# Patient Record
Sex: Female | Born: 2000 | Race: Black or African American | Hispanic: No | Marital: Single | Smoking: Never smoker
Health system: Southern US, Community
[De-identification: ages and names within clinical notes are randomized; demographics above are authoritative.]

## PROBLEM LIST (undated history)

## (undated) DIAGNOSIS — Z789 Other specified health status: Secondary | ICD-10-CM

## (undated) HISTORY — PX: LEG SURGERY: SHX1003

---

## 2019-04-09 ENCOUNTER — Inpatient Hospital Stay (HOSPITAL_COMMUNITY): Payer: BC Managed Care – PPO | Admitting: Anesthesiology

## 2019-04-09 ENCOUNTER — Other Ambulatory Visit: Payer: Self-pay

## 2019-04-09 ENCOUNTER — Inpatient Hospital Stay (HOSPITAL_COMMUNITY): Payer: BC Managed Care – PPO

## 2019-04-09 ENCOUNTER — Encounter (HOSPITAL_COMMUNITY): Payer: Self-pay | Admitting: Emergency Medicine

## 2019-04-09 ENCOUNTER — Encounter (HOSPITAL_COMMUNITY): Admission: EM | Disposition: A | Discharge status: Home / Self Care | Payer: Self-pay | Source: Home / Self Care

## 2019-04-09 ENCOUNTER — Inpatient Hospital Stay (HOSPITAL_COMMUNITY)
Admission: EM | Admit: 2019-04-09 | Discharge: 2019-04-09 | Disposition: A | Discharge status: Home / Self Care | Payer: BC Managed Care – PPO | Attending: Obstetrics and Gynecology | Admitting: Obstetrics and Gynecology

## 2019-04-09 DIAGNOSIS — Z20828 Contact with and (suspected) exposure to other viral communicable diseases: Secondary | ICD-10-CM | POA: Diagnosis not present

## 2019-04-09 DIAGNOSIS — K661 Hemoperitoneum: Secondary | ICD-10-CM

## 2019-04-09 DIAGNOSIS — R109 Unspecified abdominal pain: Secondary | ICD-10-CM | POA: Diagnosis not present

## 2019-04-09 DIAGNOSIS — O26891 Other specified pregnancy related conditions, first trimester: Secondary | ICD-10-CM | POA: Diagnosis present

## 2019-04-09 DIAGNOSIS — Z3A01 Less than 8 weeks gestation of pregnancy: Secondary | ICD-10-CM | POA: Diagnosis not present

## 2019-04-09 DIAGNOSIS — O00102 Left tubal pregnancy without intrauterine pregnancy: Secondary | ICD-10-CM | POA: Diagnosis not present

## 2019-04-09 DIAGNOSIS — O26899 Other specified pregnancy related conditions, unspecified trimester: Secondary | ICD-10-CM

## 2019-04-09 HISTORY — PX: DIAGNOSTIC LAPAROSCOPY WITH REMOVAL OF ECTOPIC PREGNANCY: SHX6449

## 2019-04-09 HISTORY — DX: Other specified health status: Z78.9

## 2019-04-09 LAB — COMPREHENSIVE METABOLIC PANEL
ALT: 12 U/L (ref 0–44)
AST: 18 U/L (ref 15–41)
Albumin: 3.7 g/dL (ref 3.5–5.0)
Alkaline Phosphatase: 44 U/L (ref 38–126)
Anion gap: 11 (ref 5–15)
BUN: 8 mg/dL (ref 6–20)
CO2: 20 mmol/L — ABNORMAL LOW (ref 22–32)
Calcium: 9.1 mg/dL (ref 8.9–10.3)
Chloride: 102 mmol/L (ref 98–111)
Creatinine, Ser: 0.58 mg/dL (ref 0.44–1.00)
GFR calc Af Amer: >60 mL/min (ref >60)
GFR calc non Af Amer: >60 mL/min (ref >60)
Glucose, Bld: 101 mg/dL — ABNORMAL HIGH (ref 70–99)
Potassium: 3.5 mmol/L (ref 3.5–5.1)
Sodium: 133 mmol/L — ABNORMAL LOW (ref 135–145)
Total Bilirubin: 0.8 mg/dL (ref 0.3–1.2)
Total Protein: 7 g/dL (ref 6.5–8.1)

## 2019-04-09 LAB — URINALYSIS, ROUTINE W REFLEX MICROSCOPIC
Bacteria, UA: NONE SEEN
Bilirubin Urine: NEGATIVE
Glucose, UA: NEGATIVE mg/dL
Ketones, ur: 20 mg/dL — AB
Nitrite: NEGATIVE
Protein, ur: 30 mg/dL — AB
Specific Gravity, Urine: 1.029 (ref 1.005–1.030)
WBC, UA: >50 WBC/hpf — ABNORMAL HIGH (ref 0–5)
pH: 5 (ref 5.0–8.0)

## 2019-04-09 LAB — CBC
HCT: 28.8 % — ABNORMAL LOW (ref 36.0–46.0)
HCT: 28.6 % — ABNORMAL LOW (ref 36.0–46.0)
Hemoglobin: 9.6 g/dL — ABNORMAL LOW (ref 12.0–15.0)
Hemoglobin: 9.5 g/dL — ABNORMAL LOW (ref 12.0–15.0)
MCH: 27.8 pg (ref 26.0–34.0)
MCH: 28.3 pg (ref 26.0–34.0)
MCHC: 33.3 g/dL (ref 30.0–36.0)
MCHC: 33.2 g/dL (ref 30.0–36.0)
MCV: 83.5 fL (ref 80.0–100.0)
MCV: 85.1 fL (ref 80.0–100.0)
Platelets: 301 10*3/uL (ref 150–400)
Platelets: 302 10*3/uL (ref 150–400)
RBC: 3.45 MIL/uL — ABNORMAL LOW (ref 3.87–5.11)
RBC: 3.36 MIL/uL — ABNORMAL LOW (ref 3.87–5.11)
RDW: 14 % (ref 11.5–15.5)
RDW: 14.1 % (ref 11.5–15.5)
WBC: 15.4 10*3/uL — ABNORMAL HIGH (ref 4.0–10.5)
WBC: 13.5 10*3/uL — ABNORMAL HIGH (ref 4.0–10.5)
nRBC: 0 % (ref 0.0–0.2)
nRBC: 0 % (ref 0.0–0.2)

## 2019-04-09 LAB — WET PREP, GENITAL
Clue Cells Wet Prep HPF POC: NONE SEEN
Sperm: NONE SEEN
Trich, Wet Prep: NONE SEEN
WBC, Wet Prep HPF POC: NONE SEEN
Yeast Wet Prep HPF POC: NONE SEEN

## 2019-04-09 LAB — HCG, QUANTITATIVE, PREGNANCY: hCG, Beta Chain, Quant, S: 6838 m[IU]/mL — ABNORMAL HIGH (ref <5)

## 2019-04-09 LAB — ABO/RH: ABO/RH(D): A POS

## 2019-04-09 LAB — PREPARE RBC (CROSSMATCH)

## 2019-04-09 LAB — SARS CORONAVIRUS 2 BY RT PCR (HOSPITAL ORDER, PERFORMED IN ~~LOC~~ HOSPITAL LAB): SARS Coronavirus 2: NEGATIVE

## 2019-04-09 LAB — POC URINE PREG, ED: Preg Test, Ur: POSITIVE — AB

## 2019-04-09 SURGERY — LAPAROSCOPY, WITH ECTOPIC PREGNANCY SURGICAL TREATMENT
Anesthesia: General

## 2019-04-09 MED ORDER — MIDAZOLAM HCL 5 MG/5ML IJ SOLN
INTRAMUSCULAR | Status: DC | PRN
  Administered 2019-04-09: 2 mg via INTRAVENOUS

## 2019-04-09 MED ORDER — DEXAMETHASONE SODIUM PHOSPHATE 10 MG/ML IJ SOLN
INTRAMUSCULAR | Status: DC | PRN
  Administered 2019-04-09: 10 mg via INTRAVENOUS

## 2019-04-09 MED ORDER — MIDAZOLAM HCL 2 MG/2ML IJ SOLN
INTRAMUSCULAR | Status: AC
  Filled 2019-04-09: qty 2

## 2019-04-09 MED ORDER — SUCCINYLCHOLINE CHLORIDE 200 MG/10ML IV SOSY
PREFILLED_SYRINGE | INTRAVENOUS | Status: AC
  Filled 2019-04-09: qty 10

## 2019-04-09 MED ORDER — LIDOCAINE 2% (20 MG/ML) 5 ML SYRINGE
INTRAMUSCULAR | Status: AC
  Filled 2019-04-09: qty 5

## 2019-04-09 MED ORDER — IBUPROFEN 600 MG PO TABS: 600.0000 mg | ORAL_TABLET | Freq: Four times a day (QID) | ORAL | 1 refills | Status: DC | PRN

## 2019-04-09 MED ORDER — ONDANSETRON HCL 4 MG/2ML IJ SOLN
INTRAMUSCULAR | Status: AC
  Filled 2019-04-09: qty 2

## 2019-04-09 MED ORDER — LIDOCAINE 2% (20 MG/ML) 5 ML SYRINGE
INTRAMUSCULAR | Status: DC | PRN
  Administered 2019-04-09: 50 mg via INTRAVENOUS

## 2019-04-09 MED ORDER — FENTANYL CITRATE (PF) 250 MCG/5ML IJ SOLN
INTRAMUSCULAR | Status: AC
  Filled 2019-04-09: qty 5

## 2019-04-09 MED ORDER — SODIUM CHLORIDE 0.9 % IR SOLN
Status: DC | PRN
  Administered 2019-04-09: 3000 mL

## 2019-04-09 MED ORDER — SUCCINYLCHOLINE CHLORIDE 200 MG/10ML IV SOSY
PREFILLED_SYRINGE | INTRAVENOUS | Status: DC | PRN
  Administered 2019-04-09: 120 mg via INTRAVENOUS

## 2019-04-09 MED ORDER — SODIUM CHLORIDE 0.9% IV SOLUTION
Freq: Once | INTRAVENOUS | Status: AC
  Administered 2019-04-09: 16:00:00 via INTRAVENOUS

## 2019-04-09 MED ORDER — LACTATED RINGERS IV SOLN
INTRAVENOUS | Status: DC
  Administered 2019-04-09 (×2): via INTRAVENOUS

## 2019-04-09 MED ORDER — PROPOFOL 10 MG/ML IV BOLUS
INTRAVENOUS | Status: AC
  Filled 2019-04-09: qty 20

## 2019-04-09 MED ORDER — GLYCOPYRROLATE PF 0.2 MG/ML IJ SOSY
PREFILLED_SYRINGE | INTRAMUSCULAR | Status: AC
  Filled 2019-04-09: qty 1

## 2019-04-09 MED ORDER — PROPOFOL 10 MG/ML IV BOLUS
INTRAVENOUS | Status: DC | PRN
  Administered 2019-04-09: 140 mg via INTRAVENOUS

## 2019-04-09 MED ORDER — SUGAMMADEX SODIUM 200 MG/2ML IV SOLN
INTRAVENOUS | Status: DC | PRN
  Administered 2019-04-09: 200 mg via INTRAVENOUS

## 2019-04-09 MED ORDER — DEXAMETHASONE SODIUM PHOSPHATE 10 MG/ML IJ SOLN
INTRAMUSCULAR | Status: AC
  Filled 2019-04-09: qty 1

## 2019-04-09 MED ORDER — PHENYLEPHRINE HCL (PRESSORS) 10 MG/ML IV SOLN
INTRAVENOUS | Status: DC | PRN
  Administered 2019-04-09 (×2): 80 ug via INTRAVENOUS

## 2019-04-09 MED ORDER — ONDANSETRON HCL 4 MG/2ML IJ SOLN
INTRAMUSCULAR | Status: DC | PRN
  Administered 2019-04-09: 4 mg via INTRAVENOUS

## 2019-04-09 MED ORDER — OXYCODONE-ACETAMINOPHEN 5-325 MG PO TABS: 1.0000 | ORAL_TABLET | Freq: Four times a day (QID) | ORAL | 0 refills | Status: DC | PRN

## 2019-04-09 MED ORDER — ACETAMINOPHEN 10 MG/ML IV SOLN
INTRAVENOUS | Status: AC
  Filled 2019-04-09: qty 100

## 2019-04-09 MED ORDER — ROCURONIUM BROMIDE 10 MG/ML (PF) SYRINGE
PREFILLED_SYRINGE | INTRAVENOUS | Status: AC
  Filled 2019-04-09: qty 10

## 2019-04-09 MED ORDER — ARTIFICIAL TEARS OPHTHALMIC OINT
TOPICAL_OINTMENT | OPHTHALMIC | Status: AC
  Filled 2019-04-09: qty 3.5

## 2019-04-09 MED ORDER — PHENYLEPHRINE 40 MCG/ML (10ML) SYRINGE FOR IV PUSH (FOR BLOOD PRESSURE SUPPORT)
PREFILLED_SYRINGE | INTRAVENOUS | Status: AC
  Filled 2019-04-09: qty 10

## 2019-04-09 MED ORDER — ROCURONIUM BROMIDE 50 MG/5ML IV SOSY
PREFILLED_SYRINGE | INTRAVENOUS | Status: DC | PRN
  Administered 2019-04-09: 40 mg via INTRAVENOUS

## 2019-04-09 MED ORDER — BUPIVACAINE HCL 0.5 % IJ SOLN
INTRAMUSCULAR | Status: DC | PRN
  Administered 2019-04-09: 10 mL

## 2019-04-09 MED ORDER — LACTATED RINGERS IV SOLN: INTRAVENOUS | Status: DC

## 2019-04-09 MED ORDER — FENTANYL CITRATE (PF) 100 MCG/2ML IJ SOLN
INTRAMUSCULAR | Status: DC | PRN
  Administered 2019-04-09 (×2): 50 ug via INTRAVENOUS
  Administered 2019-04-09: 150 ug via INTRAVENOUS

## 2019-04-09 MED ORDER — ACETAMINOPHEN 10 MG/ML IV SOLN
INTRAVENOUS | Status: DC | PRN
  Administered 2019-04-09: 1000 mg via INTRAVENOUS

## 2019-04-09 SURGICAL SUPPLY — 37 items
APPLICATOR ARISTA FLEXITIP XL (MISCELLANEOUS) IMPLANT
COVER WAND RF STERILE (DRAPES) ×2 IMPLANT
DERMABOND ADVANCED (GAUZE/BANDAGES/DRESSINGS)
DERMABOND ADVANCED .7 DNX12 (GAUZE/BANDAGES/DRESSINGS) IMPLANT
DRSG OPSITE POSTOP 3X4 (GAUZE/BANDAGES/DRESSINGS) ×2 IMPLANT
DURAPREP 26ML APPLICATOR (WOUND CARE) ×2 IMPLANT
GLOVE BIO SURGEON STRL SZ 6.5 (GLOVE) ×2 IMPLANT
GLOVE BIOGEL PI IND STRL 6.5 (GLOVE) ×2 IMPLANT
GLOVE BIOGEL PI IND STRL 7.0 (GLOVE) ×4 IMPLANT
GLOVE BIOGEL PI INDICATOR 6.5 (GLOVE) ×2
GLOVE BIOGEL PI INDICATOR 7.0 (GLOVE) ×4
GLOVE ORTHOPEDIC STR SZ6.5 (GLOVE) ×2 IMPLANT
GOWN STRL REUS W/ TWL LRG LVL3 (GOWN DISPOSABLE) ×3 IMPLANT
GOWN STRL REUS W/TWL LRG LVL3 (GOWN DISPOSABLE) ×3
HEMOSTAT ARISTA ABSORB 3G PWDR (HEMOSTASIS) IMPLANT
KIT TURNOVER KIT B (KITS) ×2 IMPLANT
NEEDLE INSUFFLATION 14GA 120MM (NEEDLE) ×2 IMPLANT
NS IRRIG 1000ML POUR BTL (IV SOLUTION) ×2 IMPLANT
PACK LAPAROSCOPY BASIN (CUSTOM PROCEDURE TRAY) ×2 IMPLANT
PACK TRENDGUARD 450 HYBRID PRO (MISCELLANEOUS) ×1 IMPLANT
POUCH SPECIMEN RETRIEVAL 10MM (ENDOMECHANICALS) ×2 IMPLANT
PROTECTOR NERVE ULNAR (MISCELLANEOUS) ×4 IMPLANT
SET IRRIG TUBING LAPAROSCOPIC (IRRIGATION / IRRIGATOR) ×2 IMPLANT
SET TUBE SMOKE EVAC HIGH FLOW (TUBING) ×2 IMPLANT
SHEARS HARMONIC ACE PLUS 36CM (ENDOMECHANICALS) ×2 IMPLANT
SLEEVE ENDOPATH XCEL 5M (ENDOMECHANICALS) ×2 IMPLANT
SLEEVE XCEL OPT CAN 5 100 (ENDOMECHANICALS) ×2 IMPLANT
SUT MNCRL AB 4-0 PS2 18 (SUTURE) ×2 IMPLANT
SUT VICRYL 0 UR6 27IN ABS (SUTURE) ×2 IMPLANT
SUT VICRYL 4-0 PS2 18IN ABS (SUTURE) ×2 IMPLANT
TOWEL GREEN STERILE FF (TOWEL DISPOSABLE) ×4 IMPLANT
TRAY FOLEY W/BAG SLVR 14FR (SET/KITS/TRAYS/PACK) ×2 IMPLANT
TRENDGUARD 450 HYBRID PRO PACK (MISCELLANEOUS) ×2
TROCAR XCEL DIL TIP R 11M (ENDOMECHANICALS) ×2 IMPLANT
TROCAR XCEL NON-BLD 11X100MML (ENDOMECHANICALS) ×2 IMPLANT
TROCAR XCEL NON-BLD 5MMX100MML (ENDOMECHANICALS) ×2 IMPLANT
WARMER LAPAROSCOPE (MISCELLANEOUS) ×2 IMPLANT

## 2019-04-09 NOTE — Discharge Instructions (Signed)
Ruptured Ectopic Pregnancy ° °An ectopic pregnancy is when a fertilized egg attaches (implants) outside of the uterus, usually in a fallopian tube. A ruptured ectopic pregnancy is when the fallopian tube tears or bursts. This results in internal bleeding, intense abdominal pain, and sometimes, vaginal bleeding. °Most ectopic pregnancies occur in the fallopian tube. In rare cases, it may occur on the ovary, intestine, pelvis, or cervix. An ectopic pregnancy does not have the ability to develop into a normal, healthy baby. A ruptured ectopic pregnancy can affect your ability to have children (fertility), depending on damage it causes to your reproductive organs. °Ruptured ectopic pregnancy is a medical emergency. If not treated immediately, it can lead to blood loss, shock, or even death. °What are the causes? °Most ectopic pregnancies are caused by damage to the fallopian tubes. The damage prevents the fertilized egg from implanting in the uterus. In some cases, the cause may not be known. °What increases the risk? °You are at increased risk for an ectopic pregnancy if: °· You have had a previous ectopic pregnancy. °· You have had previous fallopian tube surgery. °· You have had previous surgery to have the fallopian tubes tied (tubal ligation). °· You have had infertility treatments or have a history of infertility. °· You have been exposed to DES. DES is a medicine that was used until 1971 and had effects on babies whose mothers took the medicine. °· You use an IUD (intrauterine device) for birth control. °· You use progestin-only oral contraception for birth control. °· You have a history of pelvic inflammatory disease (PID). °· You have a history of endometriosis. °· You smoke. °· You became sexually active before 20 years of age. °· You have multiple sexual partners. °What are the signs or symptoms? °Symptoms of a ruptured ectopic pregnancy and internal bleeding may include: °· Sudden, severe pain in the abdomen  and pelvis. °· Dizziness or fainting. °· Pain in the shoulder area. °· Vaginal bleeding. °How is this diagnosed? °This condition is diagnosed based on your medical history, symptoms, a physical exam, and tests, which may include: °· A pregnancy test. °· An ultrasound. °· Measuring the levels of the pregnancy hormone in the bloodstream. °· Taking a sample of tissue from the uterus (dilation and curettage, D&C). °· Surgery to visually examine the inside of the abdomen using a lighted tube (laparoscopy). °How is this treated? °This condition is treated with IV fluids and emergency surgery to remove the ectopic pregnancy and repair the area where the rupture occured. °If you have lost a lot of blood, you may need a blood transfusion. °If you are Rh negative and your baby's father is Rh positive, or the Rh type of the father is unknown, you may receive a Rho (D) immune globulin shot. This is to prevent Rh problems in future pregnancies. °Additional medicines may be given. °Get help right away if: °· You are taking medicines to treat an ectopic pregnancy and you develop symptoms of a rupture. These include: °? Fever or chills. °? Shoulder pain. °? Vaginal bleeding. °? Nausea and vomiting. °? Severe abdominal pain or cramping. °? Feeling light-headed or fainting. °Summary °· An ectopic pregnancy is when a fertilized egg attaches (implants) outside of the uterus, usually in a fallopian tube. A ruptured ectopic pregnancy is when the fallopian tube tears or bursts. °· Ruptured ectopic pregnancy is a medical emergency. If not treated immediately, it can lead to blood loss, shock, or even death. °· This condition is treated with   IV fluids and emergency surgery to remove the ectopic pregnancy and repair the area where the rupture occured. If you have lost a lot of blood, you may need a blood transfusion. This information is not intended to replace advice given to you by your health care provider. Make sure you discuss any  questions you have with your health care provider. Document Released: 05/29/2000 Document Revised: 05/14/2017 Document Reviewed: 08/19/2016 Elsevier Patient Education  2020 [employer].

## 2019-04-09 NOTE — MAU Note (Signed)
Angelica Thomas is a 18 y.o. at [redacted]w[redacted]d here in MAU reporting: for 8 days she has been having cramps and bleeding, for the past 3 days it has been worse. States she is wearing a pad and on the pad she will see a spot of blood but then when she sits on the toilet more blood comes out.  Onset of complaint: ongoing  Pain score: 8/10  Vitals:   04/09/19 1120 04/09/19 1156  BP: 109/66 114/73  Pulse:  95  Resp:  16  Temp:  98.4 F (36.9 C)  SpO2:  100%     Lab orders placed from triage: UA

## 2019-04-09 NOTE — Anesthesia Procedure Notes (Signed)
Procedure Name: Intubation Date/Time: 04/09/2019 4:41 PM Performed by: Suzy Bouchard, CRNA Pre-anesthesia Checklist: Patient identified, Suction available, Patient being monitored, Timeout performed and Emergency Drugs available Patient Re-evaluated:Patient Re-evaluated prior to induction Oxygen Delivery Method: Circle system utilized Preoxygenation: Pre-oxygenation with 100% oxygen Induction Type: IV induction and Rapid sequence Laryngoscope Size: Glidescope and 3 Grade View: Grade I Tube type: Oral Tube size: 7.5 mm Number of attempts: 1 Airway Equipment and Method: Stylet and Video-laryngoscopy Placement Confirmation: ETT inserted through vocal cords under direct vision,  positive ETCO2 and breath sounds checked- equal and bilateral Secured at: 22 cm Tube secured with: Tape Dental Injury: Teeth and Oropharynx as per pre-operative assessment

## 2019-04-09 NOTE — MAU Provider Note (Addendum)
History     CSN: [id number]  Arrival date and time: 04/09/19 3086   First Provider Initiated Contact with Patient 04/09/19 1228      Chief Complaint  Patient presents with  . Vaginal Bleeding  . Abdominal Pain  . Back Pain  . Routine Prenatal Visit   HPI Nylia Gavina is a 18 y.o. G1P0 at [redacted]w[redacted]d who presents with vaginal bleeding and abdominal pain. She reports intermittent cramping for the past 10 days that has been worsening. Has been taking ibuprofen with some improvement. She also reports vaginal bleeding in the toilet and on the toilet paper for the past 8 days. It began as a small amount and has since increased. States it is lighter than a period and does not require the use of the pad.  Reports some nausea and dizziness Denies vaginal discharge, vomiting  OB History    Gravida  1   Para      Term      Preterm      AB      Living        SAB      TAB      Ectopic      Multiple      Live Births              Past Medical History:  Diagnosis Date  . Medical history non-contributory     Past Surgical History:  Procedure Laterality Date  . LEG SURGERY     left    History reviewed. No pertinent family history.  Social History   Tobacco Use  . Smoking status: Never Smoker  . Smokeless tobacco: Never Used  Substance Use Topics  . Alcohol use: Yes  . Drug use: Not Currently    Allergies: No Known Allergies  No medications prior to admission.    Review of Systems  Constitutional: Negative for fatigue and fever.  Gastrointestinal: Positive for abdominal pain and nausea. Negative for constipation, diarrhea and vomiting.  Genitourinary: Positive for flank pain and vaginal bleeding. Negative for dysuria and vaginal discharge.  Neurological: Positive for dizziness.   Physical Exam   Blood pressure 114/73, pulse 95, temperature 98.4 F (36.9 C), temperature source Oral, resp. rate 16, height 5' 6.5" (1.689 m), weight 67.6 kg, last menstrual  period 02/24/2019, SpO2 100 %.  Physical Exam  Constitutional: She is oriented to person, place, and time. She appears well-developed and well-nourished.  HENT:  Head: Normocephalic.  Eyes: Conjunctivae are normal.  Neck: Normal range of motion.  Cardiovascular: Normal rate.  Respiratory: Effort normal.  GI: Soft. She exhibits no distension. There is no abdominal tenderness.  Genitourinary:    Uterus normal.  Cervix exhibits no motion tenderness. Right adnexum displays no tenderness. Left adnexum displays no tenderness.    Vaginal bleeding present.  There is bleeding in the vagina.  Neurological: She is alert and oriented to person, place, and time.  Skin: Skin is warm and dry.   Results for orders placed or performed during the hospital encounter of 04/09/19 (from the past 24 hour(s))  POC Urine Pregnancy, ED (not at [facility])     Status: Abnormal   Collection Time: 04/09/19 10:46 AM  Result Value Ref Range   Preg Test, Ur POSITIVE (A) NEGATIVE  Urinalysis, Routine w reflex microscopic     Status: Abnormal   Collection Time: 04/09/19 12:11 PM  Result Value Ref Range   Color, Urine AMBER (A) YELLOW   APPearance HAZY (A) CLEAR  Specific Gravity, Urine 1.029 1.005 - 1.030   pH 5.0 5.0 - 8.0   Glucose, UA NEGATIVE NEGATIVE mg/dL   Hgb urine dipstick MODERATE (A) NEGATIVE   Bilirubin Urine NEGATIVE NEGATIVE   Ketones, ur 20 (A) NEGATIVE mg/dL   Protein, ur 30 (A) NEGATIVE mg/dL   Nitrite NEGATIVE NEGATIVE   Leukocytes,Ua MODERATE (A) NEGATIVE   RBC / HPF 0-5 0 - 5 RBC/hpf   WBC, UA >50 (H) 0 - 5 WBC/hpf   Bacteria, UA NONE SEEN NONE SEEN   Mucus PRESENT   CBC     Status: Abnormal   Collection Time: 04/09/19  1:00 PM  Result Value Ref Range   WBC 15.4 (H) 4.0 - 10.5 K/uL   RBC 3.45 (L) 3.87 - 5.11 MIL/uL   Hemoglobin 9.6 (L) 12.0 - 15.0 g/dL   HCT 16.3 (L) 84.6 - 65.9 %   MCV 83.5 80.0 - 100.0 fL   MCH 27.8 26.0 - 34.0 pg   MCHC 33.3 30.0 - 36.0 g/dL   RDW 93.5 70.1 - 77.9 %    Platelets 301 150 - 400 K/uL   nRBC 0.0 0.0 - 0.2 %  Comprehensive metabolic panel     Status: Abnormal   Collection Time: 04/09/19  1:00 PM  Result Value Ref Range   Sodium 133 (L) 135 - 145 mmol/L   Potassium 3.5 3.5 - 5.1 mmol/L   Chloride 102 98 - 111 mmol/L   CO2 20 (L) 22 - 32 mmol/L   Glucose, Bld 101 (H) 70 - 99 mg/dL   BUN 8 6 - 20 mg/dL   Creatinine, Ser 3.90 0.44 - 1.00 mg/dL   Calcium 9.1 8.9 - 30.0 mg/dL   Total Protein 7.0 6.5 - 8.1 g/dL   Albumin 3.7 3.5 - 5.0 g/dL   AST 18 15 - 41 U/L   ALT 12 0 - 44 U/L   Alkaline Phosphatase 44 38 - 126 U/L   Total Bilirubin 0.8 0.3 - 1.2 mg/dL   GFR calc non Af Amer >60 >60 mL/min   GFR calc Af Amer >60 >60 mL/min   Anion gap 11 5 - 15  ABO/Rh     Status: None   Collection Time: 04/09/19  1:00 PM  Result Value Ref Range   ABO/RH(D) A POS    No rh immune globuloin      NOT A RH IMMUNE GLOBULIN CANDIDATE, PT RH POSITIVE Performed at [facility], [address] [place], [place] [address]   hCG, quantitative, pregnancy     Status: Abnormal   Collection Time: 04/09/19  1:00 PM  Result Value Ref Range   hCG, Beta Chain, Quant, S 6,838 (H) <5 mIU/mL  Wet prep, genital     Status: None   Collection Time: 04/09/19  1:01 PM  Result Value Ref Range   Yeast Wet Prep HPF POC NONE SEEN NONE SEEN   Trich, Wet Prep NONE SEEN NONE SEEN   Clue Cells Wet Prep HPF POC NONE SEEN NONE SEEN   WBC, Wet Prep HPF POC NONE SEEN NONE SEEN   Sperm NONE SEEN    US Ob Comp Less 14 Wks  Result Date: 04/09/2019 CLINICAL DATA:  18 year old pregnant female presents with abdominal pain. Quantitative beta HCG [id number]. EDC by LMP: 12/01/2019, projecting to an expected gestational age of [redacted] weeks 2 days. EXAM: OBSTETRIC <14 WK [place] AND TRANSVAGINAL OB US TECHNIQUE: Both transabdominal and transvaginal ultrasound examinations were performed for complete evaluation  of the gestation as well as the maternal uterus, adnexal regions, and pelvic cul-de-sac.  Transvaginal technique was performed to assess early pregnancy. COMPARISON:  None. FINDINGS: Intrauterine gestational sac: None Maternal uterus/adnexae: Anteverted uterus is normal in size and configuration, with no uterine fibroids or other myometrial abnormalities. Bilayer endometrial thickness 15 mm. Heterogeneous endometrium with small amount of ill-defined fluid in the left endometrial cavity, with no discrete endometrial mass. Right ovary measures 3.0 x 1.5 x 1.5 cm and is normal. No right adnexal mass. There is a 3.5 x 3.5 x 4.3 cm cyst left adnexal mass between the uterus and left ovary, with an internal structure resembling a gestational sac with thick vascularized wall and no yolk sac or embryo demonstrated. The separate left ovary measures 3.9 x 2.2 x 1.8 cm. There is a large amount of ill-defined clot in the left adnexa (measuring 6.3 x 5.0 x 4.7 cm in largest dimensions). There is a large volume of hemoperitoneum throughout the pelvis extending into the bilateral pericolic gutters and right subhepatic space. IMPRESSION: 1. Ultrasound findings are compatible with ruptured left tubal ectopic gestation with large amount of blood products in the left adnexa measuring 6.3 x 5.0 x 4.7 cm in largest dimensions. Central gestational sac-like structure within this left adnexal mass. No yolk sac or embryo demonstrated within this left adnexal mass. 2. Large volume hemoperitoneum extending into the paracolic gutters and right subhepatic space. 3. No intrauterine gestational sac. Heterogeneous thickened endometrium compatible with blood products. Critical Value/emergent results were called by telephone at the time of interpretation on 04/09/2019 at 3:11 pm to provider Luna KitchensKATHRYN Tayana Shankle and OB/GYN DR. DAVIS, Who verbally acknowledged these results. Electronically Signed   By: Delbert PhenixJason A Poff M.D.   On: 04/09/2019 15:14   [place] Ob Transvaginal  Result Date: 04/09/2019 CLINICAL DATA:  18 year old pregnant female presents  with abdominal pain. Quantitative beta HCG [id number]. EDC by LMP: 12/01/2019, projecting to an expected gestational age of [redacted] weeks 2 days. EXAM: OBSTETRIC <14 WK US AND TRANSVAGINAL OB US TECHNIQUE: Both transabdominal and transvaginal ultrasound examinations were performed for complete evaluation of the gestation as well as the maternal uterus, adnexal regions, and pelvic cul-de-sac. Transvaginal technique was performed to assess early pregnancy. COMPARISON:  None. FINDINGS: Intrauterine gestational sac: None Maternal uterus/adnexae: Anteverted uterus is normal in size and configuration, with no uterine fibroids or other myometrial abnormalities. Bilayer endometrial thickness 15 mm. Heterogeneous endometrium with small amount of ill-defined fluid in the left endometrial cavity, with no discrete endometrial mass. Right ovary measures 3.0 x 1.5 x 1.5 cm and is normal. No right adnexal mass. There is a 3.5 x 3.5 x 4.3 cm cyst left adnexal mass between the uterus and left ovary, with an internal structure resembling a gestational sac with thick vascularized wall and no yolk sac or embryo demonstrated. The separate left ovary measures 3.9 x 2.2 x 1.8 cm. There is a large amount of ill-defined clot in the left adnexa (measuring 6.3 x 5.0 x 4.7 cm in largest dimensions). There is a large volume of hemoperitoneum throughout the pelvis extending into the bilateral pericolic gutters and right subhepatic space. IMPRESSION: 1. Ultrasound findings are compatible with ruptured left tubal ectopic gestation with large amount of blood products in the left adnexa measuring 6.3 x 5.0 x 4.7 cm in largest dimensions. Central gestational sac-like structure within this left adnexal mass. No yolk sac or embryo demonstrated within this left adnexal mass. 2. Large volume hemoperitoneum extending into the paracolic gutters and  right subhepatic space. 3. No intrauterine gestational sac. Heterogeneous thickened endometrium compatible with blood  products. Critical Value/emergent results were called by telephone at the time of interpretation on 04/09/2019 at 3:11 pm to provider Maye Hides and OB/GYN DR. DAVIS, Who verbally acknowledged these results. Electronically Signed   By: Ilona Sorrel M.D.   On: 04/09/2019 15:14    MAU Course  Procedures Orders Placed This Encounter  Procedures  . Wet prep, genital  . US OB Transvaginal  . US OB Comp Less 14 Wks  . Urinalysis, Routine w reflex microscopic  . CBC  . Comprehensive metabolic panel  . hCG, quantitative, pregnancy  . POC Urine Pregnancy, ED (not at [facility])  . ABO/Rh    MDM - GC/C and wet prep swabbed - CBC, CMP, ABO/Rg and hCG drawn - Sent for U/S  Assessment and Plan    1. Abdominal pain affecting pregnancy    Plan -hcg [phone] - U/S with no visible IUP and internal structure resembling a gestational sac in the left adnexa with a large amount of ill-defined clot. There is also a large volume of fluid throughout the pelvis extending up to the liver - discussed left tubal ectopic results with patient and the need for surgery, all of patients questions answered - results discussed and reviewed by Dr. Lillie Fragmin A Kimker 04/09/2019, 2:44 PM    I confirm that I have verified the information documented in the physician assistant student's note and that I have also personally reperformed the history, physical exam and all medical decision making activities of this service and have verified that all service and findings are accurately documented in this student's note.   -Patient came [facility] after presenting with complaints of abdominal pain and vaginal bleeding.  -No CMT on exam; patient reports that it feels like "pressure" on bimanual but not pain.  -Scant dark red blood in the vaginal vault, two small clots removed, no active bleeding on speculum -Radiology called with report concerning for left ectopic pregnancy; Dr. Rosana Hoes aware.  -IV started, patient last ate  at 11 pm last night, had water at 9 am this morning.  -Brief overview of surgical steps reviewed, Dr. Rosana Hoes to explain in detail.  - Patient Vitals for the past 24 hrs:  BP Temp Temp src Pulse Resp SpO2 Height Weight  04/09/19 1457 107/73 - - 89 16 - - -  04/09/19 1156 114/73 98.4 F (36.9 C) Oral 95 16 100 % - -  04/09/19 1153 - - - - - - 5' 6.5" (1.689 m) 149 lb 1.6 oz (67.6 kg)  04/09/19 1120 109/66 - - - - - - -  04/09/19 1035 (!) 83/44 - - 77 12 97 % - -  04/09/19 1013 103/65 98.3 F (36.8 C) - 87 16 96 % - -    -wet prep negative -CBC is 9/28; white count is 13.5, however, patient is afebrile.    Starr [place], CNM 04/09/2019 4:12 PM

## 2019-04-09 NOTE — Anesthesia Preprocedure Evaluation (Addendum)
Anesthesia Evaluation  Patient identified by MRN, date of birth, ID band  Reviewed: Allergy & Precautions, NPO status , Patient's Chart, lab work & pertinent test resultsPreop documentation limited or incomplete due to emergent nature of procedure.  Airway Mallampati: II  TM Distance: >3 FB Neck ROM: Full    Dental  (+) Teeth Intact, Dental Advisory Given,  Upper right front incisor is "glued back" by dentist for a chip:   Pulmonary  Covid test not drawn or resulted. Surgeon unwilling to wait for results.    breath sounds clear to auscultation       Cardiovascular  Rhythm:Regular     Neuro/Psych Headaches: Arrival date and time: 04/09/19 6010   First Provider Initiated Contact with Patient 04/09/19 1228  , negative neurological ROS  negative psych ROS   GI/Hepatic   Endo/Other    Renal/GU      Musculoskeletal   Abdominal   Peds  Hematology   Anesthesia Other Findings Arrival date and time: 04/09/19 0845  + pregnancy test: 1046  First Provider Initiated Contact with Patient 04/09/19 1228    [place] ordered: 1238  Case posted: ~1516  Covid test ordered: [place]  Covid drawn: [place]  Patient arrived to OR: 1619  Covid resulted: [place]  Surgeon changed and Roselie Awkward arrives to OR at 1644  Reproductive/Obstetrics (+) Pregnancy                          Anesthesia Physical Anesthesia Plan  ASA: II and emergent  Anesthesia Plan: General   Post-op Pain Management:    Induction: Intravenous, Rapid sequence and Cricoid pressure planned  PONV Risk Score and Plan: 3 and Ondansetron and Dexamethasone  Airway Management Planned: Oral ETT  Additional Equipment: None  Intra-op Plan:   Post-operative Plan: Extubation in OR  Informed Consent: I have reviewed the patients History and Physical, chart, labs and discussed the procedure including the risks, benefits and alternatives for the proposed  anesthesia with the patient or authorized representative who has indicated his/her understanding and acceptance.     Dental advisory given  Plan Discussed with: CRNA and Surgeon  Anesthesia Plan Comments:         Anesthesia Quick Evaluation

## 2019-04-09 NOTE — H&P (Addendum)
OB/GYN History and Physical  Angelica Thomas is a 18 y.o. G1P0 presenting for vaginal bleeding and abdominal pain. Intermittent cramping for the past 10 days, improved with ibuprofen.     Past Medical History:  Diagnosis Date  . Medical history non-contributory     Past Surgical History:  Procedure Laterality Date  . LEG SURGERY     left    OB History  Gravida Para Term Preterm AB Living  1            SAB TAB Ectopic Multiple Live Births               # Outcome Date GA Lbr Len/2nd Weight Sex Delivery Anes PTL Lv  1 Current             Social History   Socioeconomic History  . Marital status: Single    Spouse name: Not on file  . Number of children: Not on file  . Years of education: Not on file  . Highest education level: Not on file  Occupational History  . Not on file  Social Needs  . Financial resource strain: Not on file  . Food insecurity    Worry: Not on file    Inability: Not on file  . Transportation needs    Medical: Not on file    Non-medical: Not on file  Tobacco Use  . Smoking status: Never Smoker  . Smokeless tobacco: Never Used  Substance and Sexual Activity  . Alcohol use: Yes  . Drug use: Not Currently  . Sexual activity: Yes    Birth control/protection: None  Lifestyle  . Physical activity    Days per week: Not on file    Minutes per session: Not on file  . Stress: Not on file  Relationships  . Social Musicianconnections    Talks on phone: Not on file    Gets together: Not on file    Attends religious service: Not on file    Active member of club or organization: Not on file    Attends meetings of clubs or organizations: Not on file    Relationship status: Not on file  Other Topics Concern  . Not on file  Social History Narrative  . Not on file    History reviewed. No pertinent family history.  No medications prior to admission.    No Known Allergies  Review of Systems: Negative except for what is mentioned in HPI.      Physical Exam: BP 107/73 (BP Location: Right Arm)   Pulse 89   Temp 98.4 F (36.9 C) (Oral)   Resp 16   Ht 5' 6.5" (1.689 m)   Wt 67.6 kg   LMP 02/24/2019   SpO2 100%   BMI 23.70 kg/m  CONSTITUTIONAL: Well-developed, well-nourished female in mild distress.  HENT:  Normocephalic, atraumatic, External right and left ear normal. Oropharynx is clear and moist EYES: Conjunctivae and EOM are normal. Pupils are equal, round, and reactive to light. No scleral icterus.  NECK: Normal range of motion, supple, no masses SKIN: Skin is warm and dry. No rash noted. Not diaphoretic. No erythema. No pallor. NEUROLGIC: Alert and oriented to person, place, and time. Normal reflexes, muscle tone coordination. No cranial nerve deficit noted. PSYCHIATRIC: Normal mood and affect. Normal behavior. Normal judgment and thought content. CARDIOVASCULAR: Normal heart rate noted RESPIRATORY: Effort normal, no problems with respiration noted ABDOMEN: Soft, moderately tender in all quadrants, nondistended PELVIC: Deferred MUSCULOSKELETAL: Normal range of motion. No  edema and no tenderness. 2+ distal pulses.   Pertinent Labs/Studies:   Results for orders placed or performed during the hospital encounter of 04/09/19 (from the past 72 hour(s))  POC Urine Pregnancy, ED (not at [facility])     Status: Abnormal   Collection Time: 04/09/19 10:46 AM  Result Value Ref Range   Preg Test, Ur POSITIVE (A) NEGATIVE    Comment:        THE SENSITIVITY OF THIS METHODOLOGY IS >24 mIU/mL   Urinalysis, Routine w reflex microscopic     Status: Abnormal   Collection Time: 04/09/19 12:11 PM  Result Value Ref Range   Color, Urine AMBER (A) YELLOW    Comment: BIOCHEMICALS MAY BE AFFECTED BY COLOR   APPearance HAZY (A) CLEAR   Specific Gravity, Urine 1.029 1.005 - 1.030   pH 5.0 5.0 - 8.0   Glucose, UA NEGATIVE NEGATIVE mg/dL   Hgb urine dipstick MODERATE (A) NEGATIVE   Bilirubin Urine NEGATIVE NEGATIVE   Ketones, ur 20 (A)  NEGATIVE mg/dL   Protein, ur 30 (A) NEGATIVE mg/dL   Nitrite NEGATIVE NEGATIVE   Leukocytes,Ua MODERATE (A) NEGATIVE   RBC / HPF 0-5 0 - 5 RBC/hpf   WBC, UA >50 (H) 0 - 5 WBC/hpf   Bacteria, UA NONE SEEN NONE SEEN   Mucus PRESENT     Comment: Performed at [facility], [address] [place], [place] [address]  CBC     Status: Abnormal   Collection Time: 04/09/19  1:00 PM  Result Value Ref Range   WBC 15.4 (H) 4.0 - 10.5 K/uL   RBC 3.45 (L) 3.87 - 5.11 MIL/uL   Hemoglobin 9.6 (L) 12.0 - 15.0 g/dL   HCT 83.4 (L) 19.6 - 22.2 %   MCV 83.5 80.0 - 100.0 fL   MCH 27.8 26.0 - 34.0 pg   MCHC 33.3 30.0 - 36.0 g/dL   RDW 97.9 89.2 - 11.9 %   Platelets 301 150 - 400 K/uL   nRBC 0.0 0.0 - 0.2 %    Comment: Performed at [facility], [address], [place], [place] [address]  Comprehensive metabolic panel     Status: Abnormal   Collection Time: 04/09/19  1:00 PM  Result Value Ref Range   Sodium 133 (L) 135 - 145 mmol/L   Potassium 3.5 3.5 - 5.1 mmol/L   Chloride 102 98 - 111 mmol/L   CO2 20 (L) 22 - 32 mmol/L   Glucose, Bld 101 (H) 70 - 99 mg/dL   BUN 8 6 - 20 mg/dL   Creatinine, Ser 8.14 0.44 - 1.00 mg/dL   Calcium 9.1 8.9 - 48.1 mg/dL   Total Protein 7.0 6.5 - 8.1 g/dL   Albumin 3.7 3.5 - 5.0 g/dL   AST 18 15 - 41 U/L   ALT 12 0 - 44 U/L   Alkaline Phosphatase 44 38 - 126 U/L   Total Bilirubin 0.8 0.3 - 1.2 mg/dL   GFR calc non Af Amer >60 >60 mL/min   GFR calc Af Amer >60 >60 mL/min   Anion gap 11 5 - 15    Comment: Performed at [facility], [address], [place], [place] [address]  ABO/Rh     Status: None   Collection Time: 04/09/19  1:00 PM  Result Value Ref Range   ABO/RH(D) A POS    No rh immune globuloin      NOT A RH IMMUNE GLOBULIN CANDIDATE, PT  RH POSITIVE Performed at [facility], [address] [place], [place] [address]   hCG, quantitative, pregnancy     Status: Abnormal   Collection Time: 04/09/19  1:00 PM  Result Value Ref Range    hCG, Beta Chain, Quant, S 6,838 (H) <5 mIU/mL    Comment:          GEST. AGE      CONC.  (mIU/mL)   <=1 WEEK        5 - 50     2 WEEKS       50 - 500     3 WEEKS       100 - 10,000     4 WEEKS     1,000 - 30,000     5 WEEKS     3,500 - 115,000   6-8 WEEKS     12,000 - 270,000    12 WEEKS     15,000 - 220,000        FEMALE AND NON-PREGNANT FEMALE:     LESS THAN 5 mIU/mL Performed at [facility], [address] [place], [place] [address]   Wet prep, genital     Status: None   Collection Time: 04/09/19  1:01 PM  Result Value Ref Range   Yeast Wet Prep HPF POC NONE SEEN NONE SEEN   Trich, Wet Prep NONE SEEN NONE SEEN   Clue Cells Wet Prep HPF POC NONE SEEN NONE SEEN   WBC, Wet Prep HPF POC NONE SEEN NONE SEEN   Sperm NONE SEEN     Comment: Performed at [facility], [address] [place], [place] [address]   CLINICAL DATA:  18 year old pregnant female presents with abdominal pain. Quantitative beta HCG [id number].  EDC by LMP: 12/01/2019, projecting to an expected gestational age of [redacted] weeks 2 days.  EXAM: OBSTETRIC <14 WK [place] AND TRANSVAGINAL OB US  TECHNIQUE: Both transabdominal and transvaginal ultrasound examinations were performed for complete evaluation of the gestation as well as the maternal uterus, adnexal regions, and pelvic cul-de-sac. Transvaginal technique was performed to assess early pregnancy.  COMPARISON:  None.  FINDINGS: Intrauterine gestational sac: None  Maternal uterus/adnexae: Anteverted uterus is normal in size and configuration, with no uterine fibroids or other myometrial abnormalities. Bilayer endometrial thickness 15 mm. Heterogeneous endometrium with small amount of ill-defined fluid in the left endometrial cavity, with no discrete endometrial mass.  Right ovary measures 3.0 x 1.5 x 1.5 cm and is normal. No right adnexal mass.  There is a 3.5 x 3.5 x 4.3 cm cyst left adnexal mass between the uterus and left ovary, with  an internal structure resembling a gestational sac with thick vascularized wall and no yolk sac or embryo demonstrated. The separate left ovary measures 3.9 x 2.2 x 1.8 cm. There is a large amount of ill-defined clot in the left adnexa (measuring 6.3 x 5.0 x 4.7 cm in largest dimensions). There is a large volume of hemoperitoneum throughout the pelvis extending into the bilateral pericolic gutters and right subhepatic space.  IMPRESSION: 1. Ultrasound findings are compatible with ruptured left tubal ectopic gestation with large amount of blood products in the left adnexa measuring 6.3 x 5.0 x 4.7 cm in largest dimensions. Central gestational sac-like structure within this left adnexal mass. No yolk sac or embryo demonstrated within this left adnexal mass. 2. Large volume hemoperitoneum extending into the paracolic gutters and right subhepatic space. 3. No intrauterine gestational sac. Heterogeneous  thickened endometrium compatible with blood products.  Critical Value/emergent results were called by telephone at the time of interpretation on 04/09/2019 at 3:11 pm to provider Maye Hides and OB/GYN DR. Tajuan Dufault, Who verbally acknowledged these results.   Electronically Signed   By: Ilona Sorrel M.D.   On: 04/09/2019 15:14     Assessment and Plan :Angelica Thomas is a 18 y.o. G1P0 who presents for abdominal pain and cramping. Reports pain is better today than yesterday but still having pain. HCG 0,712. [place] today with left tubal ectopic, clot around ectopic with significant hemoperitoneum. Reviewed need for surgical removal of ectopic pregnancy with patient, she is agreeable. Reviewed likelihood of needing salpingectomy as ectopic appears to be in left tube. Reviewed possibility of oophorectomy if ectopic appears to be in ovary.   Had water this am but has not eaten since last night.  The risks of laparoscopic surgery were discussed with the patient including but not limited to:  bleeding which may require transfusion or reoperation; infection which may require antibiotics; injury to bowel, bladder, ureters or other surrounding organs; need for additional procedures including laparotomy; thromboembolic phenomenon, incisional problems and other postoperative/anesthesia complications. Patient verbalized understanding of the above and consent signed. Answered all questions.  She is agreeable to blood transfusion in the event of emergency.   Plan for diagnostic laparoscopy, removal of ectopic pregnancy NPO Admission labs ordered VS Q4 Type & screen COVID swab pending 2 units of blood on hold   K. Arvilla Meres, M.D. Attending [place], [place] for [place]   ADDENDUM  Due to concurrent emergency, backup called in and report given to Dr. Eloy End, who will proceed with as above.  Feliz Beam, M.D. Attending [facility] for [employer] farm manager)

## 2019-04-09 NOTE — ED Triage Notes (Signed)
Pt. Stated, I started having some stomach cramkping with vaginal bleeding and some back pain. I think Im [redacted] weeks pregnant pregnant.

## 2019-04-09 NOTE — Transfer of Care (Signed)
Immediate Anesthesia Transfer of Care Note  Patient: Angelica Thomas  Procedure(s) Performed: DIAGNOSTIC LAPAROSCOPY WITH REMOVAL OF ECTOPIC PREGNANCY (N/A )  Patient Location: PACU  Anesthesia Type:General  Level of Consciousness: awake and alert   Airway & Oxygen Therapy: Patient Spontanous Breathing and Patient connected to nasal cannula oxygen  Post-op Assessment: Report given to RN, Post -op Vital signs reviewed and stable and Patient moving all extremities X 4  Post vital signs: Reviewed and stable  Last Vitals:  Vitals Value Taken Time  BP 107/73 04/09/19 1803  Temp 36.5 C 04/09/19 1803  Pulse 113 04/09/19 1804  Resp 17 04/09/19 1804  SpO2 100 % 04/09/19 1804  Vitals shown include unvalidated device data.  Last Pain:  Vitals:   04/09/19 1156  TempSrc: Oral  PainSc: 8          Complications: No apparent anesthesia complications

## 2019-04-09 NOTE — Op Note (Signed)
Angelica Thomas PROCEDURE DATE: 04/09/2019  PREOPERATIVE DIAGNOSIS: Ruptured ectopic pregnancy POSTOPERATIVE DIAGNOSIS: Ruptured left fallopian tube ectopic pregnancy PROCEDURE: Laparoscopic left salpingectomy and removal of ectopic pregnancy SURGEON:  Woodroe Mode, MD ANESTHESIOLOGIST: Oleta Mouse, MD Anesthesiologist: Oleta Mouse, MD CRNA: Suzy Bouchard, CRNA  INDICATIONS: 18 y.o. G1P0 at [redacted]w[redacted]d here with the preoperative diagnoses as listed above.  Please refer to preoperative notes for more details. Patient was counseled regarding need for laparoscopic salpingectomy. Risks of surgery including bleeding which may require transfusion or reoperation, infection, injury to bowel or other surrounding organs, need for additional procedures including laparotomy and other postoperative/anesthesia complications were explained to patient.  Written informed consent was obtained.  FINDINGS:  Moderate amount of hemoperitoneum estimated to be about 451ml of blood and clots.  Dilated left fallopian tube containing ectopic gestation. Small normal appearing uterus, normal right fallopian tube, right ovary and left ovary.  ANESTHESIA: General INTRAVENOUS FLUIDS: 1000 ml ESTIMATED BLOOD LOSS: 400 ml hemoperitoneum URINE OUTPUT: 100 ml SPECIMENS: Left fallopian tube containing ectopic gestation COMPLICATIONS: None immediate  PROCEDURE IN DETAIL:  The patient was taken to the operating room where general anesthesia was administered and was found to be adequate.  She was placed in the dorsal lithotomy position, and was prepped and draped in a sterile manner.  A Foley catheter was inserted into her bladder and attached to constant drainage and a uterine manipulator was then advanced into the uterus .    After an adequate timeout was performed, attention was turned to the abdomen where an umbilical incision was made with the scalpel.  The Optiview 5-mm trocar and sleeve were then advanced without  difficulty with the laparoscope under direct visualization into the abdomen.  The abdomen was then insufflated with carbon dioxide gas and adequate pneumoperitoneum was obtained.  A survey of the patient's pelvis and abdomen revealed the findings above.  A 11-mm left lower quadrant port and a 5 mm RLQ port were then placed under direct visualization.  The Nezhat suction irrigator was then used to suction the hemoperitoneum and irrigate the pelvis.  Attention was then turned to the left fallopian tube which was grasped and ligated from the underlying mesosalpinx and uterine attachment using the Harmonic instrument.  Good hemostasis was noted.  The specimen was placed in an EndoCatch bag and removed from the abdomen intact.  The abdomen was desufflated, and all instruments were removed.  The fascial incision of the 11-mm site was reapproximated with a 0 Vicryl figure-of-eight stitch; and all skin incisions were closed with 4-0 Vicryl and Dermabond. The patient tolerated the procedure well.  All instruments, needles, and sponge counts were correct x 2. The patient was taken to the recovery room in stable condition.   The patient will be discharged to home as per PACU criteria.  Routine postoperative instructions given.  She was prescribed Percocet, Ibuprofen.  She will follow up in the clinic in about 2-3 weeks for postoperative evaluation.   Woodroe Mode, MD 04/09/2019 5:57 PM

## 2019-04-10 ENCOUNTER — Encounter: Payer: Self-pay | Admitting: Obstetrics & Gynecology

## 2019-04-10 ENCOUNTER — Encounter (HOSPITAL_COMMUNITY): Payer: Self-pay | Admitting: Obstetrics & Gynecology

## 2019-04-10 ENCOUNTER — Encounter: Payer: Self-pay | Admitting: Obstetrics and Gynecology

## 2019-04-10 ENCOUNTER — Telehealth: Payer: Self-pay | Admitting: *Deleted

## 2019-04-10 NOTE — Telephone Encounter (Signed)
Received a voicemessage from this afternoon from [place]. She states she is trying to get ahold of Dr.Arnold to get  a note for work. Asks for a call asap. I called [place] and left a message I am returning her call and she can call us back tomorrow as our phones are off today since it is after 4 and we can probably help with her request. Jacques Navy

## 2019-04-11 LAB — TYPE AND SCREEN
ABO/RH(D): A POS
Antibody Screen: NEGATIVE
Unit division: 0
Unit division: 0

## 2019-04-11 LAB — BPAM RBC
Blood Product Expiration Date: 202011192359
Blood Product Expiration Date: 202011202359
Unit Type and Rh: 6200
Unit Type and Rh: 6200

## 2019-04-11 LAB — GC/CHLAMYDIA PROBE AMP (~~LOC~~) NOT AT ARMC
Chlamydia: NEGATIVE
Comment: NEGATIVE
Comment: NORMAL
Neisseria Gonorrhea: NEGATIVE

## 2019-04-11 LAB — SURGICAL PATHOLOGY

## 2019-04-12 ENCOUNTER — Encounter: Payer: Self-pay | Admitting: Obstetrics and Gynecology

## 2019-04-12 NOTE — Anesthesia Postprocedure Evaluation (Signed)
Anesthesia Post Note  Patient: Minta Fair  Procedure(s) Performed: DIAGNOSTIC LAPAROSCOPY WITH REMOVAL OF ECTOPIC PREGNANCY (N/A )     Patient location during evaluation: PACU Anesthesia Type: General Level of consciousness: awake and alert Pain management: pain level controlled Vital Signs Assessment: post-procedure vital signs reviewed and stable Respiratory status: spontaneous breathing, nonlabored ventilation, respiratory function stable and patient connected to nasal cannula oxygen Cardiovascular status: blood pressure returned to baseline and stable Postop Assessment: no apparent nausea or vomiting Anesthetic complications: no    Last Vitals:  Vitals:   04/09/19 1820 04/09/19 1835  BP: 107/70 106/68  Pulse: 94 98  Resp: (!) 21 18  Temp: 36.5 C   SpO2: 100% 100%    Last Pain:  Vitals:   04/09/19 1820  TempSrc:   PainSc: 0-No pain                 Field Staniszewski

## 2019-04-17 ENCOUNTER — Telehealth: Payer: Self-pay | Admitting: Emergency Medicine

## 2019-04-17 NOTE — Telephone Encounter (Signed)
Pt called and left a message on the nurse voicemail line stating that she was trying to reach Dr. Roselie Awkward to discuss her recovery from surgery on 10/25.   Pt call returned and pt states she does not feel as though she can return to work on 11/9 as stated on work note. Pt stated she has concerns about her incision site because it looks as though it may be separating. Pt also requests a refill on oxycodone stating "it helps me sleep at night because I move a lot in my sleep and it causes the pain".   Per Dr. Roselie Awkward, pt needs to be scheduled for an in-person visit this week to have concerns addressed and pt can take the prescribed ibuprofen for pain.   Pt called and informed of available appointment for 11/3 @ 9:35 and pt accepted. Pt informed that her concerns regarding her incision site and return to work would be addressed at appointment. Pt also instructed to take the prescribed ibuprofen as needed for pain. Pt aware of visitor restrictions and mask requirement during visit. Pt verbalized understanding and had no further questions or concerns.

## 2019-04-18 ENCOUNTER — Encounter: Payer: Self-pay | Admitting: Family Medicine

## 2019-04-18 ENCOUNTER — Ambulatory Visit: Payer: Medicaid Other | Admitting: Obstetrics and Gynecology

## 2019-04-21 ENCOUNTER — Telehealth: Payer: Self-pay | Admitting: Obstetrics and Gynecology

## 2019-04-21 NOTE — Telephone Encounter (Signed)
The patient would like to be contacted regarding when she can go back to work.

## 2019-04-26 ENCOUNTER — Encounter: Payer: Self-pay | Admitting: Obstetrics & Gynecology

## 2019-04-26 ENCOUNTER — Other Ambulatory Visit: Payer: Self-pay

## 2019-04-26 ENCOUNTER — Ambulatory Visit (INDEPENDENT_AMBULATORY_CARE_PROVIDER_SITE_OTHER): Payer: Self-pay | Admitting: Obstetrics & Gynecology

## 2019-04-26 VITALS — BP 103/65 | HR 91 | Ht 66.5 in | Wt 147.4 lb

## 2019-04-26 DIAGNOSIS — O00102 Left tubal pregnancy without intrauterine pregnancy: Secondary | ICD-10-CM

## 2019-04-26 DIAGNOSIS — Z30011 Encounter for initial prescription of contraceptive pills: Secondary | ICD-10-CM

## 2019-04-26 DIAGNOSIS — K661 Hemoperitoneum: Secondary | ICD-10-CM

## 2019-04-26 DIAGNOSIS — Z9889 Other specified postprocedural states: Secondary | ICD-10-CM

## 2019-04-26 MED ORDER — FERROUS SULFATE 325 (65 FE) MG PO TABS: 325.0000 mg | ORAL_TABLET | Freq: Every day | ORAL | 3 refills | Status: DC

## 2019-04-26 MED ORDER — NORGESTIM-ETH ESTRAD TRIPHASIC 0.18/0.215/0.25 MG-25 MCG PO TABS: 1.0000 | ORAL_TABLET | Freq: Every day | ORAL | 11 refills | Status: DC

## 2019-04-26 NOTE — Patient Instructions (Signed)
Diagnostic Laparoscopy, Care After °This sheet gives you information about how to care for yourself after your procedure. Your health care provider may also give you more specific instructions. If you have problems or questions, contact your health care provider. °What can I expect after the procedure? °After the procedure, it is common to have: °· Mild discomfort in the abdomen. °· Sore throat. °Women who have laparoscopy with pelvic examination may have mild cramping and fluid coming from the vagina for a few days after the procedure. °Follow these instructions at home: °Medicines °· Take over-the-counter and prescription medicines only as told by your health care provider. °· If you were prescribed an antibiotic medicine, take it as told by your health care provider. Do not stop taking the antibiotic even if you start to feel better. °Driving °· Do not drive for 24 hours if you were given a medicine to help you relax (sedative) during your procedure. °· Do not drive or use heavy machinery while taking prescription pain medicine. °Bathing °· Do not take baths, swim, or use a hot tub until your health care provider approves. You may take showers. °Incision care ° °· Follow instructions from your health care provider about how to take care of your incisions. Make sure you: °? Wash your hands with soap and water before you change your bandage (dressing). If soap and water are not available, use hand sanitizer. °? Change your dressing as told by your health care provider. °? Leave stitches (sutures), skin glue, or adhesive strips in place. These skin closures may need to stay in place for 2 weeks or longer. If adhesive strip edges start to loosen and curl up, you may trim the loose edges. Do not remove adhesive strips completely unless your health care provider tells you to do that. °· Check your incision areas every day for signs of infection. Check for: °? Redness, swelling, or pain. °? Fluid or  blood. °? Warmth. °? Pus or a bad smell. °Activity °· Return to your normal activities as told by your health care provider. Ask your health care provider what activities are safe for you. °· Do not lift anything that is heavier than 10 lb (4.5 kg), or the limit that you are told, until your health care provider says that it is safe. °General instructions °· To prevent or treat constipation while you are taking prescription pain medicine, your health care provider may recommend that you: °? Drink enough fluid to keep your urine pale yellow. °? Take over-the-counter or prescription medicines. °? Eat foods that are high in fiber, such as fresh fruits and vegetables, whole grains, and beans. °? Limit foods that are high in fat and processed sugars, such as fried and sweet foods. °· Do not use any products that contain nicotine or tobacco, such as cigarettes and e-cigarettes. If you need help quitting, ask your health care provider. °· Keep all follow-up visits as told by your health care provider. This is important. °Contact a health care provider if: °· You develop shoulder pain. °· You feel lightheaded or faint. °· You are unable to pass gas or have a bowel movement. °· You feel nauseous or you vomit. °· You develop a rash. °· You have redness, swelling, or pain around any incision. °· You have fluid or blood coming from any incision. °· Any incision feels warm to the touch. °· You have pus or a bad smell coming from any incision. °· You have a fever or chills. °Get help   right away if: °· You have severe pain. °· You have vomiting that does not go away. °· You have heavy bleeding from the vagina. °· Any incision opens. °· You have trouble breathing. °· You have chest pain. °Summary °· After the procedure, it is common to have mild discomfort in the abdomen and a sore throat. °· Check your incision areas every day for signs of infection. °· Return to your normal activities as told by your health care provider. Ask  your health care provider what activities are safe for you. °This information is not intended to replace advice given to you by your health care provider. Make sure you discuss any questions you have with your health care provider. °Document Released: 05/13/2015 Document Revised: 05/14/2017 Document Reviewed: 11/25/2016 °Elsevier Patient Education © 2020 [employer] Inc[employer] ° °

## 2019-04-26 NOTE — Progress Notes (Signed)
Pt states has so,e pain in abdomen area off & on and she feels the pain if she moves a certain way or if she presses her stomach. Also has a strange bruise on lower right stomach.

## 2019-04-26 NOTE — Progress Notes (Signed)
Subjective:right side pain postop     Angelica Thomas is a 18 y.o. female who presents to the clinic 2 weeks status post laparoscopy for left tubal ectopic pregnancy. Eating a regular diet without difficulty. Bowel movements are normal. Pain is controlled without any medications.  The following portions of the patient's history were reviewed and updated as appropriate: allergies, current medications, past family history, past medical history, past social history, past surgical history and problem list.  Review of Systems Pertinent items are noted in HPI.    Objective:    BP 103/65   Pulse 91   Ht 5' 6.5" (1.689 m)   Wt 147 lb 6.4 oz (66.9 kg)   LMP 02/24/2019 Comment: Ectopic  Breastfeeding Unknown   BMI 23.43 kg/m  General:  alert, cooperative and no distress  Abdomen: soft, non-tender, small bruise RLQ  Incision:   healing well, no drainage, no erythema, no hernia, no seroma, no swelling, no dehiscence, incision well approximated     Assessment:    Postoperative course complicated by mild pain 2.5 weeks postop Operative findings again reviewed. Pathology report discussed.    Plan:    1. Continue any current medications. 2. Wound care discussed. 3. Activity restrictions: no lifting more than 15 pounds 4. Anticipated return to work: 1-2 weeks. 5. Follow up:  Prn CBC today, FeSO4 Requests OCP and Rx sent  . Patient ID: Fatima Sanger, female   DOB: 19-Oct-2000, 18 y.o.   MRN: [id number] Woodroe Mode, MD 04/26/2019

## 2019-04-27 LAB — CBC
Hematocrit: 30 % — ABNORMAL LOW (ref 34.0–46.6)
Hemoglobin: 9.7 g/dL — ABNORMAL LOW (ref 11.1–15.9)
MCH: 27.2 pg (ref 26.6–33.0)
MCHC: 32.3 g/dL (ref 31.5–35.7)
MCV: 84 fL (ref 79–97)
Platelets: 337 10*3/uL (ref 150–450)
RBC: 3.57 x10E6/uL — ABNORMAL LOW (ref 3.77–5.28)
RDW: 13.1 % (ref 11.7–15.4)
WBC: 7.1 10*3/uL (ref 3.4–10.8)

## 2019-05-01 NOTE — Telephone Encounter (Signed)
Patient had appt on 11/11 and received work letter at that visit.

## 2019-05-02 ENCOUNTER — Ambulatory Visit: Payer: Medicaid Other | Admitting: Obstetrics & Gynecology

## 2019-05-07 DIAGNOSIS — Z029 Encounter for administrative examinations, unspecified: Secondary | ICD-10-CM

## 2020-05-04 ENCOUNTER — Inpatient Hospital Stay (HOSPITAL_COMMUNITY)
Admission: AD | Admit: 2020-05-04 | Discharge: 2020-05-04 | Disposition: A | Discharge status: Home / Self Care | Payer: BC Managed Care – PPO | Attending: Obstetrics & Gynecology | Admitting: Obstetrics & Gynecology

## 2020-05-04 ENCOUNTER — Encounter (HOSPITAL_COMMUNITY): Payer: Self-pay | Admitting: Obstetrics & Gynecology

## 2020-05-04 ENCOUNTER — Other Ambulatory Visit: Payer: Self-pay

## 2020-05-04 DIAGNOSIS — Z9079 Acquired absence of other genital organ(s): Secondary | ICD-10-CM

## 2020-05-04 DIAGNOSIS — Z3202 Encounter for pregnancy test, result negative: Secondary | ICD-10-CM

## 2020-05-04 DIAGNOSIS — R1032 Left lower quadrant pain: Secondary | ICD-10-CM | POA: Insufficient documentation

## 2020-05-04 MED ORDER — IBUPROFEN 800 MG PO TABS: 800.0000 mg | ORAL_TABLET | Freq: Four times a day (QID) | ORAL | 1 refills | Status: AC | PRN

## 2020-05-04 NOTE — MAU Provider Note (Signed)
First Provider Initiated Contact with Patient 05/04/20 [phone]      S Ms. Angelica Thomas is a 19 y.o. G1P0010 patient who presents to MAU today with complaint of left side abdominal pain. She states the pain started at 0400 and did not wake her from her sleep.  She states the pain is "the same pain I had when I had the surgery."  She states "it will take a break, but is most of the time." She states she has not taken anything for the pain. She endorses current pain and rates it a 5/10. She also endorses starting her menstrual cycle on the 11/18 and states her bleeding is "not heavy at all."   O BP 104/74 (BP Location: Right Arm)   Pulse 99   Temp 99 F (37.2 C) (Oral)   Resp 20   Ht 5\' 6"  (1.676 m)   Wt 67.7 kg   LMP 05/02/2020   BMI 24.10 kg/m   Review of Systems  Constitutional: Negative for chills and fever.  Respiratory: Negative for cough and shortness of breath.   Gastrointestinal: Positive for abdominal pain (Left side). Negative for constipation, diarrhea, nausea and vomiting.  Genitourinary: Negative for dysuria.  Musculoskeletal: Negative for back pain.  Neurological: Negative for dizziness and headaches.    Physical Exam Constitutional:      Appearance: Normal appearance.  HENT:     Head: Normocephalic and atraumatic.  Eyes:     Conjunctiva/sclera: Conjunctivae normal.  Cardiovascular:     Rate and Rhythm: Normal rate and regular rhythm.     Heart sounds: Normal heart sounds.  Pulmonary:     Effort: Pulmonary effort is normal. No respiratory distress.     Breath sounds: Normal breath sounds.  Abdominal:     General: Abdomen is flat. Bowel sounds are normal.     Palpations: Abdomen is soft.     Tenderness: There is no abdominal tenderness.  Musculoskeletal:        General: Normal range of motion.     Cervical back: Normal range of motion.  Skin:    General: Skin is warm and dry.  Neurological:     Mental Status: She is alert and oriented to person, place, and  time.  Psychiatric:        Mood and Affect: Mood normal.        Behavior: Behavior normal.        Thought Content: Thought content normal.     A Medical screening exam complete Abdominal Pain-LLQ Worried by physically Well  P Informed of negative UPT Patient informed of salpingectomy and complete removal of left tube. Educated on how ectopic pregnancy can not occur if not pregnant! Reviewed what could be causing LLQ abdominal pain including ovary, bowels, or uterine cramping. Offered and accepts ibuprofen for pain-Rx to pharmacy on file. Addressed patient questions regarding future fertility. Encouraged initiation of care at gynecological office of her choice. Encouraged usage of condoms to avoid STDs which could result in damage to right tube. Provided reassurance to patient regarding complaints. Precautions given. Patient encouraged to go to [facility] or UC if symptoms worsen. Discharge from MAU in stable condition  [facility], Gerrit Heck 05/04/2020 6:40 AM

## 2020-05-04 NOTE — Discharge Instructions (Signed)
Abdominal Pain, Adult Pain in the abdomen (abdominal pain) can be caused by many things. Often, abdominal pain is not serious and it gets better with no treatment or by being treated at home. However, sometimes abdominal pain is serious. Your health care provider will ask questions about your medical history and do a physical exam to try to determine the cause of your abdominal pain. Follow these instructions at home:  Medicines  Take over-the-counter and prescription medicines only as told by your health care provider.  Do not take a laxative unless told by your health care provider. General instructions  Watch your condition for any changes.  Drink enough fluid to keep your urine pale yellow.  Keep all follow-up visits as told by your health care provider. This is important. Contact a health care provider if:  Your abdominal pain changes or gets worse.  You are not hungry or you lose weight without trying.  You are constipated or have diarrhea for more than 2-3 days.  You have pain when you urinate or have a bowel movement.  Your abdominal pain wakes you up at night.  Your pain gets worse with meals, after eating, or with certain foods.  You are vomiting and cannot keep anything down.  You have a fever.  You have blood in your urine. Get help right away if:  Your pain does not go away as soon as your health care provider told you to expect.  You cannot stop vomiting.  Your pain is only in areas of the abdomen, such as the right side or the left lower portion of the abdomen. Pain on the right side could be caused by appendicitis.  You have bloody or black stools, or stools that look like tar.  You have severe pain, cramping, or bloating in your abdomen.  You have signs of dehydration, such as: ? Dark urine, very little urine, or no urine. ? Cracked lips. ? Dry mouth. ? Sunken eyes. ? Sleepiness. ? Weakness.  You have trouble breathing or chest  pain. Summary  Often, abdominal pain is not serious and it gets better with no treatment or by being treated at home. However, sometimes abdominal pain is serious.  Watch your condition for any changes.  Take over-the-counter and prescription medicines only as told by your health care provider.  Contact a health care provider if your abdominal pain changes or gets worse.  Get help right away if you have severe pain, cramping, or bloating in your abdomen. This information is not intended to replace advice given to you by your health care provider. Make sure you discuss any questions you have with your health care provider. Document Revised: 10/10/2018 Document Reviewed: 10/10/2018 Elsevier Patient Education  2020 [employer]  

## 2020-05-04 NOTE — MAU Note (Addendum)
PT SAYS SHE HAD CYCLE 04-07-20,  AND NOW STARTED ON 05-02-20 . HPT ON 18TH WAS NEG. SHE HAS PAIN ON LEFT LOWER ABD - STARTED AT 0400. ( HAS HX OF ECTOPIC- SO PT IS WORRIED. )  NO MED FOR PAIN . SAYS  IN TRIAGE- NO PAD - NO BLEEDING .

## 2020-10-08 IMAGING — US US OB COMP LESS 14 WK
1 series · 14 of 28 positions shown · non-contrast
Comparison: None.

CLINICAL DATA: 18-year-old pregnant female presents with abdominal
pain. Quantitative beta HCG [DATE].

EDC by LMP: 12/01/2019, projecting to an expected gestational age of
6 weeks 2 days.
EXAM:
OBSTETRIC <14 WK US AND TRANSVAGINAL OB US
TECHNIQUE: Both transabdominal and transvaginal ultrasound examinations were
performed for complete evaluation of the gestation as well as the
maternal uterus, adnexal regions, and pelvic cul-de-sac.
Transvaginal technique was performed to assess early pregnancy.

[Series 1: us ob comp less 14 wk · 14 of 112 slices shown]
[im 5/112]
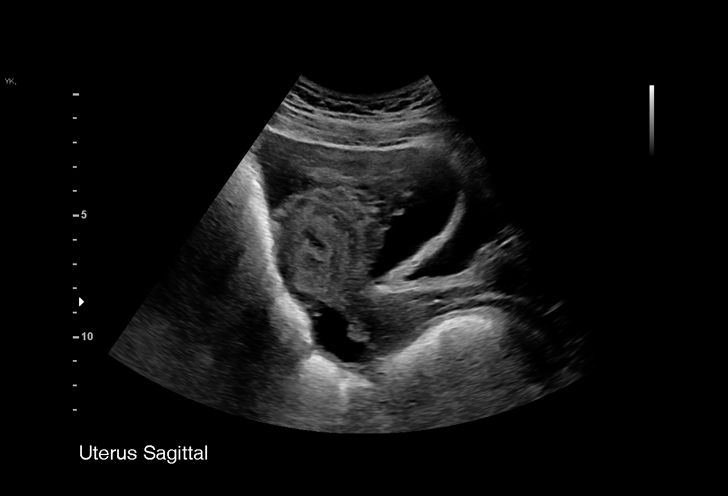
[im 13/112]
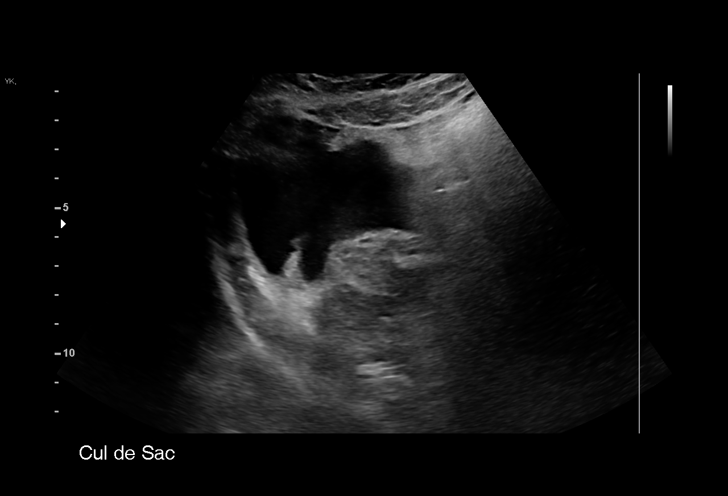
[im 21/112]
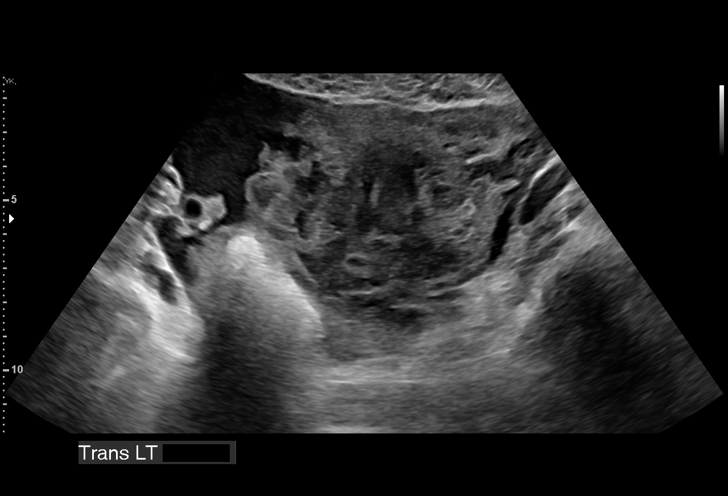
[im 29/112]
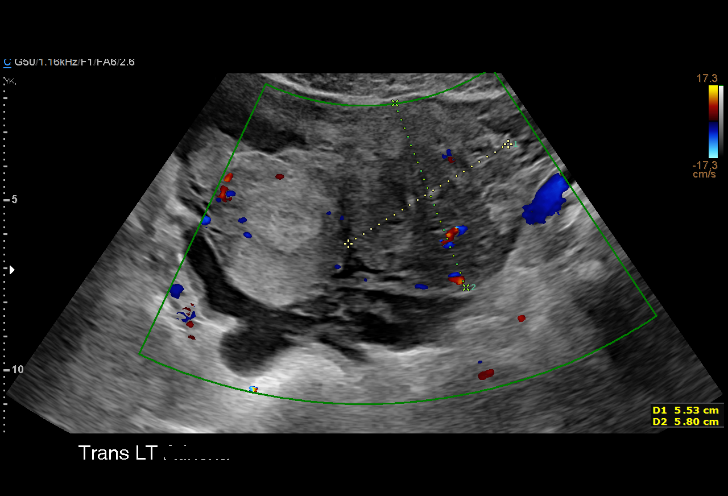
[im 38/112]
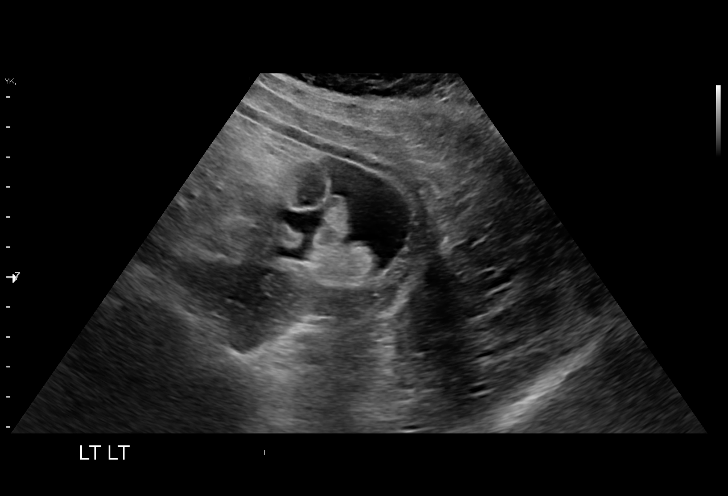
[im 46/112]
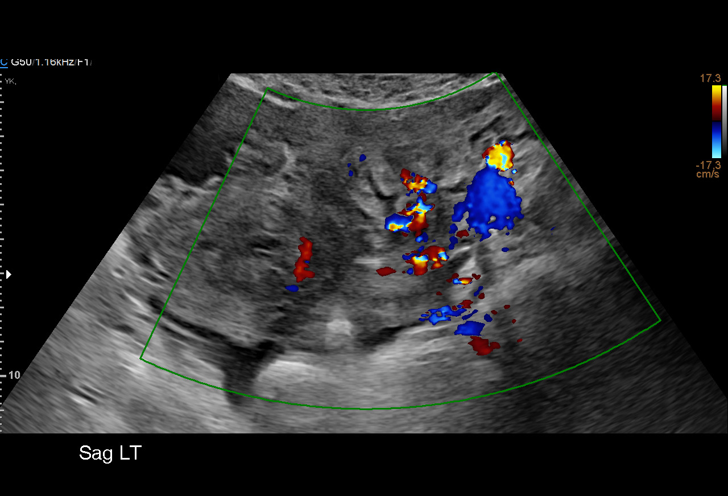
[im 54/112]
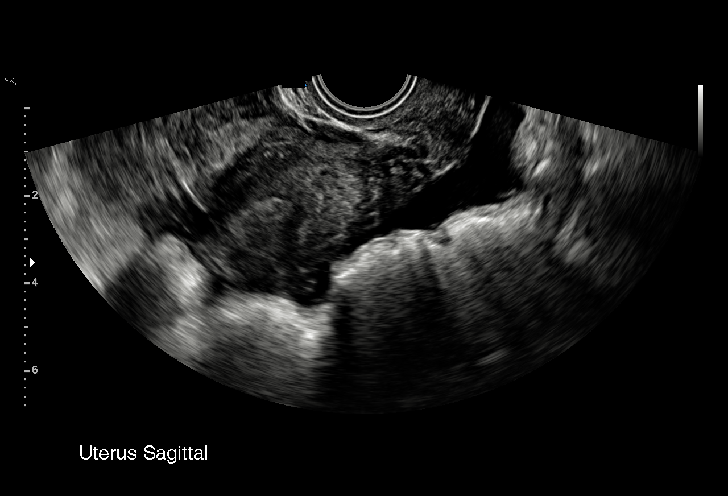
[im 62/112]
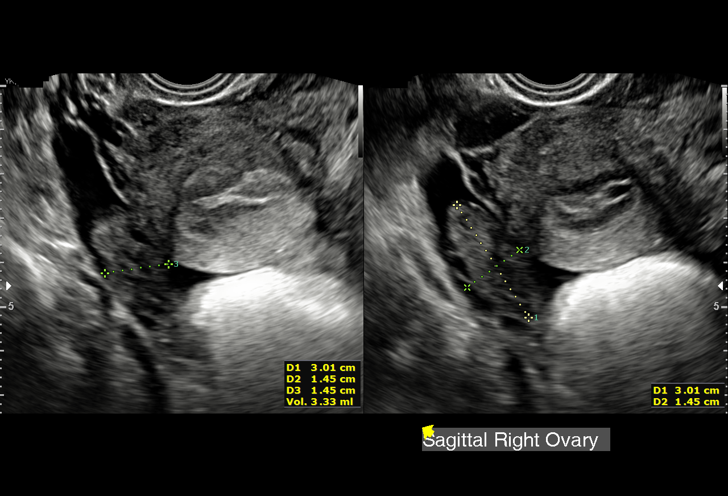
[im 70/112]
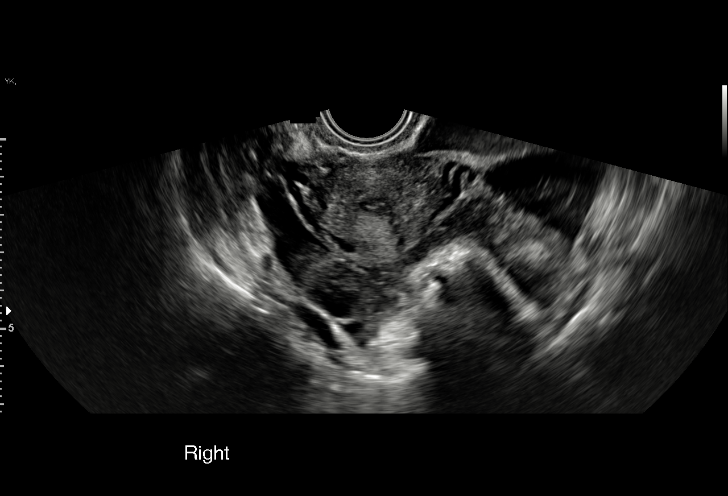
[im 79/112]
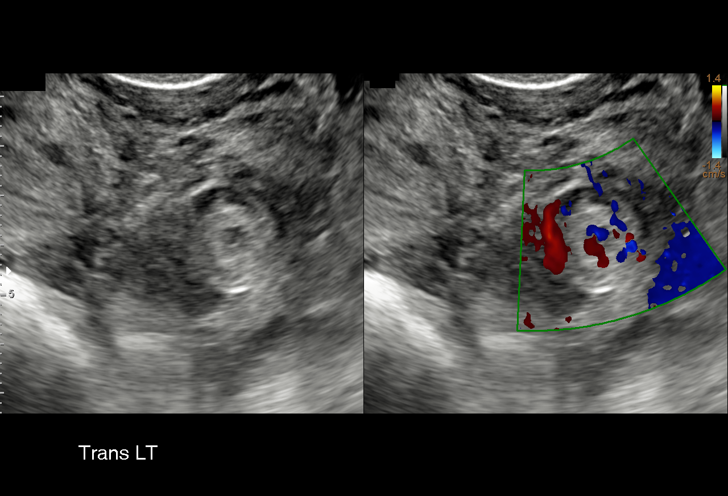
[im 87/112]
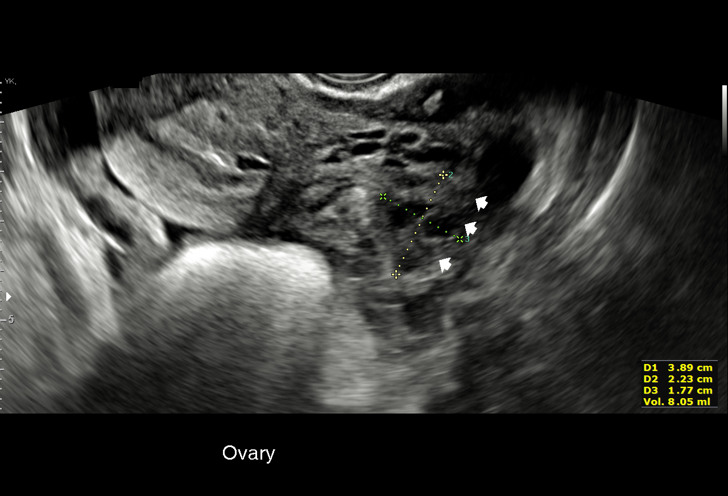
[im 95/112]
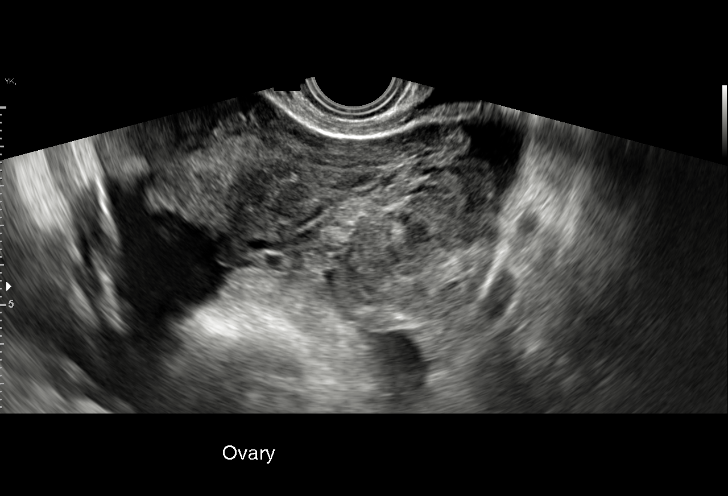
[im 103/112]
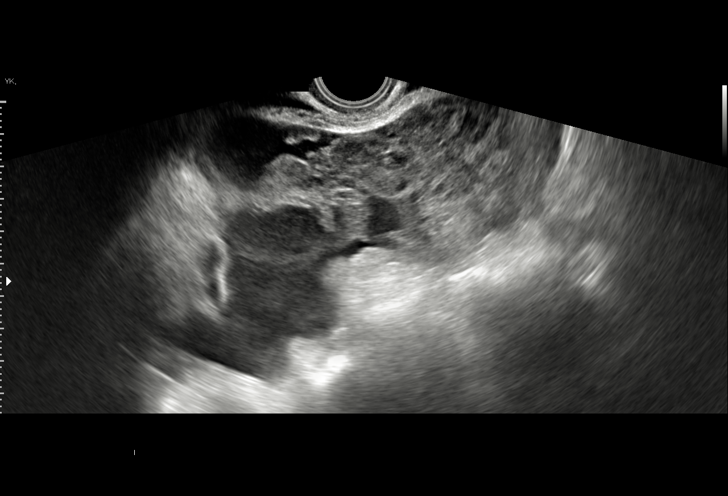
[im 112/112]
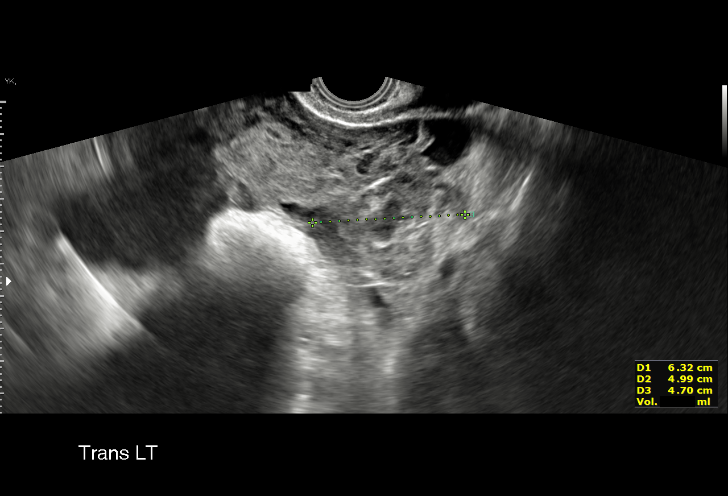

[14 of 28 positions shown; findings below may reference images not displayed]

FINDINGS: Intrauterine gestational sac: None

Maternal uterus/adnexae: Anteverted uterus is normal in size and
configuration, with no uterine fibroids or other myometrial
abnormalities. Bilayer endometrial thickness 15 mm. Heterogeneous
endometrium with small amount of ill-defined fluid in the left
endometrial cavity, with no discrete endometrial mass.

Right ovary measures 3.0 x 1.5 x 1.5 cm and is normal. No right
adnexal mass.

There is a 3.5 x 3.5 x 4.3 cm cyst left adnexal mass between the
uterus and left ovary, with an internal structure resembling a
gestational sac with thick vascularized wall and no yolk sac or
embryo demonstrated. The separate left ovary measures 3.9 x 2.2 x
1.8 cm. There is a large amount of ill-defined clot in the left
adnexa (measuring 6.3 x 5.0 x 4.7 cm in largest dimensions). There
is a large volume of hemoperitoneum throughout the pelvis extending
into the bilateral pericolic gutters and right subhepatic space.
IMPRESSION: 1. Ultrasound findings are compatible with ruptured left tubal
ectopic gestation with large amount of blood products in the left
adnexa measuring 6.3 x 5.0 x 4.7 cm in largest dimensions. Central
gestational sac-like structure within this left adnexal mass. No
yolk sac or embryo demonstrated within this left adnexal mass.
2. Large volume hemoperitoneum extending into the paracolic gutters
and right subhepatic space.
3. No intrauterine gestational sac. Heterogeneous thickened
endometrium compatible with blood products.

Critical Value/emergent results were called by telephone at the time
of interpretation on 04/09/2019 at [DATE] to provider HAFIJUL
MARIUS and OB/GYN DR. RTOYOTA, Who verbally acknowledged these
results.

## 2020-10-08 IMAGING — US US OB TRANSVAGINAL
1 series · 14 of 28 positions shown · non-contrast
Comparison: None.

CLINICAL DATA: 18-year-old pregnant female presents with abdominal
pain. Quantitative beta HCG [DATE].

EDC by LMP: 12/01/2019, projecting to an expected gestational age of
6 weeks 2 days.
EXAM:
OBSTETRIC <14 WK US AND TRANSVAGINAL OB US
TECHNIQUE: Both transabdominal and transvaginal ultrasound examinations were
performed for complete evaluation of the gestation as well as the
maternal uterus, adnexal regions, and pelvic cul-de-sac.
Transvaginal technique was performed to assess early pregnancy.

[Series 1: us ob transvaginal · 111 acquisitions, 14 frames shown]
[im 5/111]
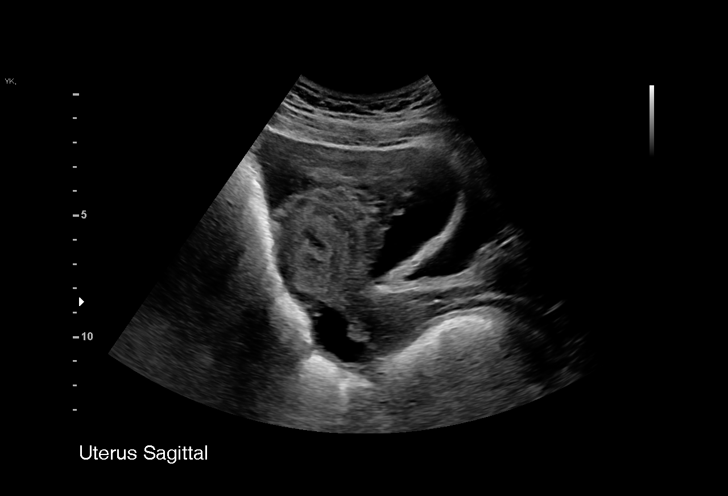
[im 13/111]
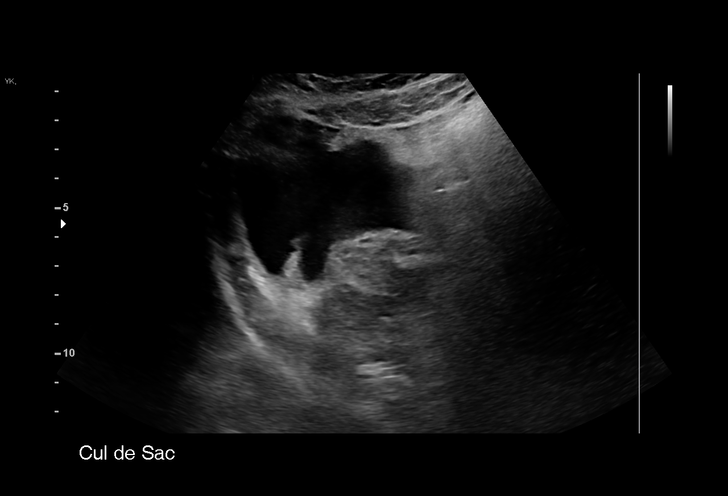
[im 21/111]
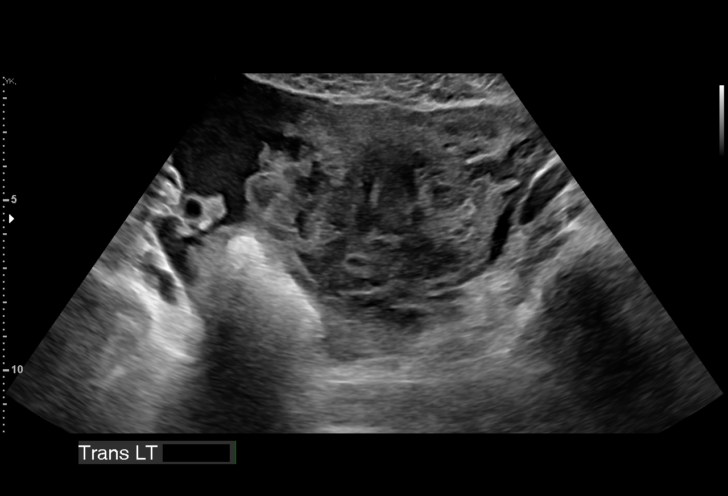
[im 29/111]
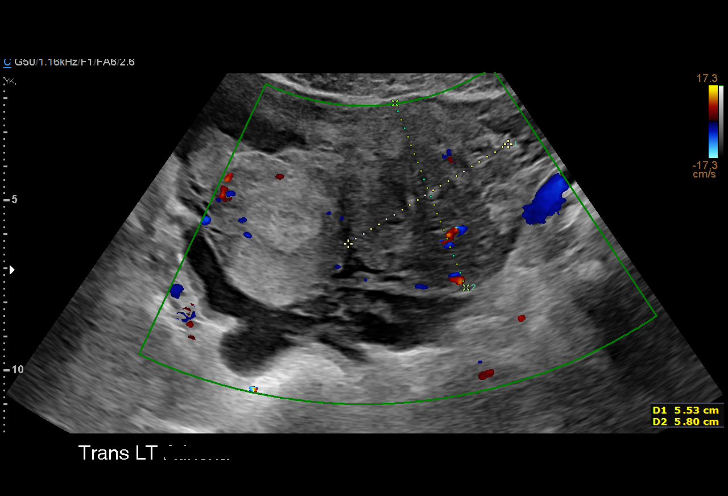
[im 37/111]
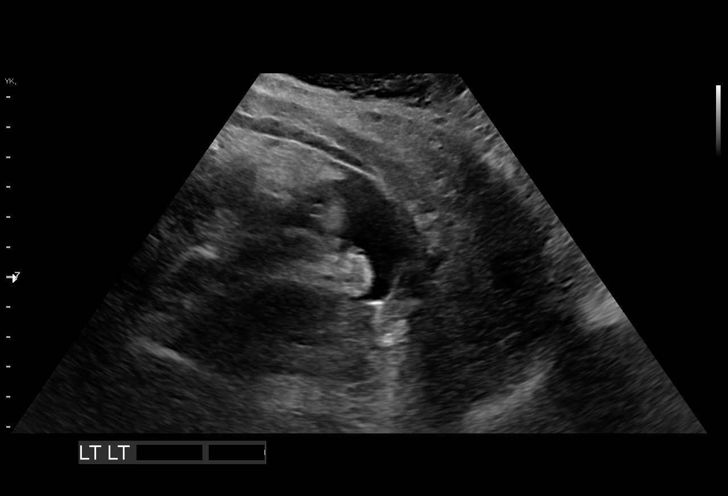
[im 45/111]
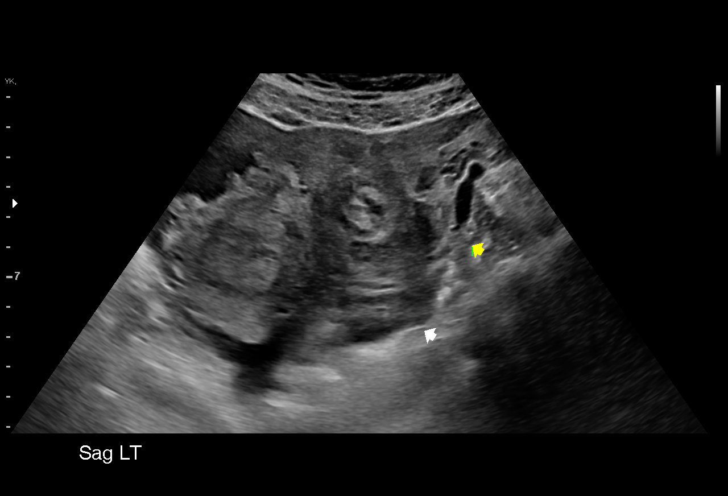
[im 53/111]
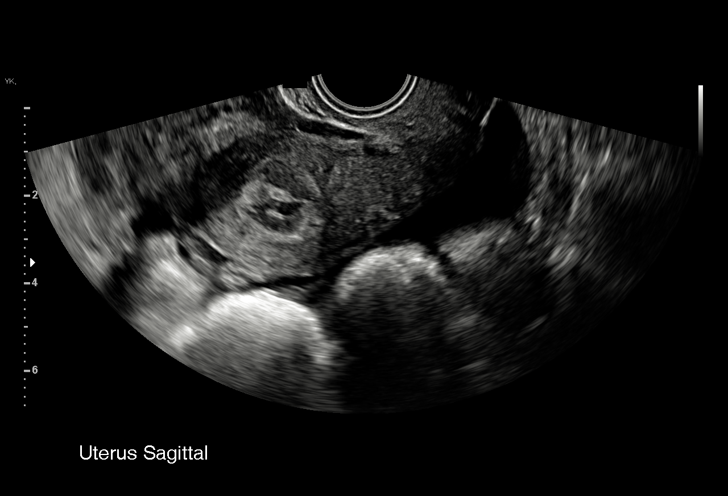
[im 62/111]
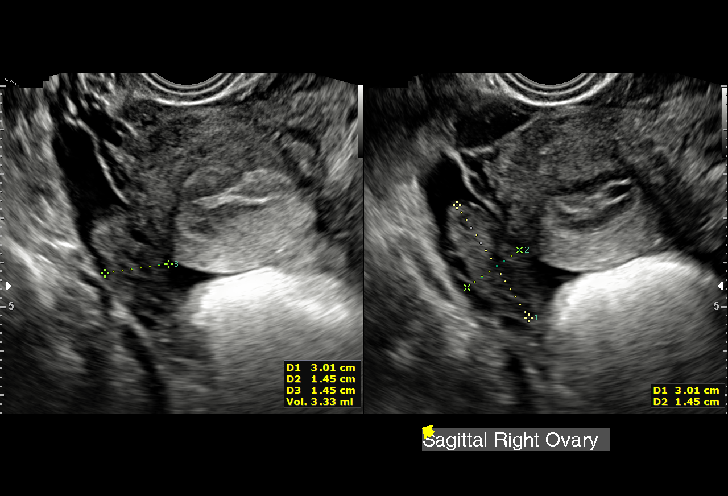
[im 70/111]
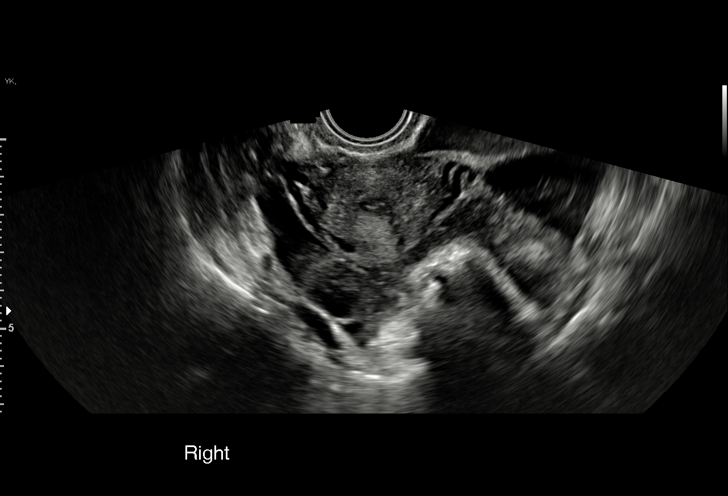
[im 78/111]
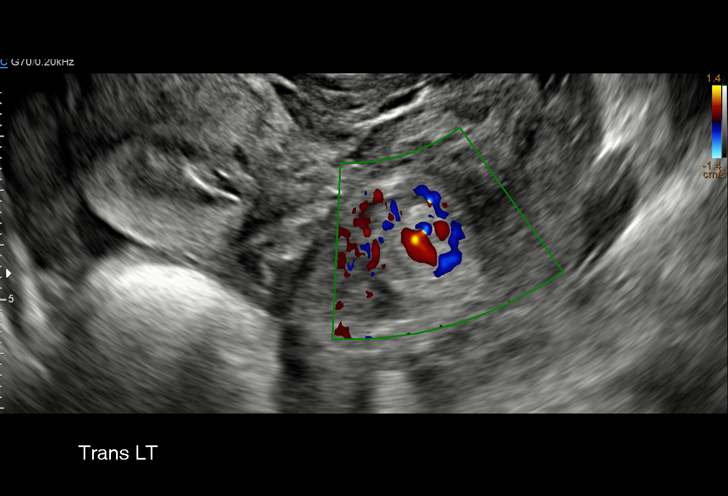
[im 86/111]
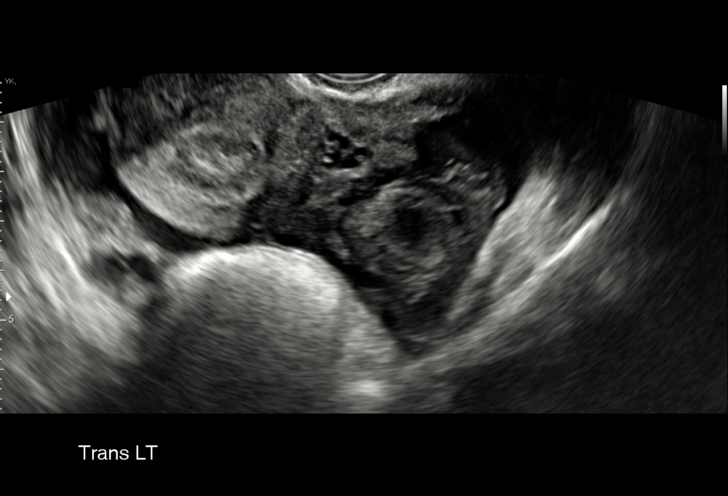
[im 94/111]
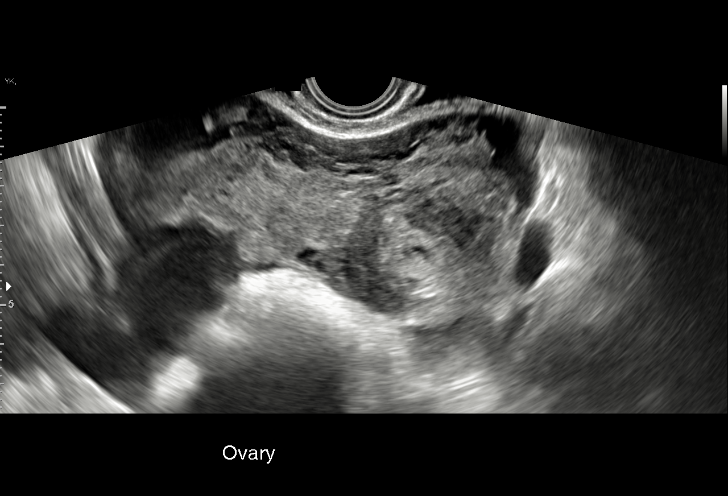
[im 102/111]
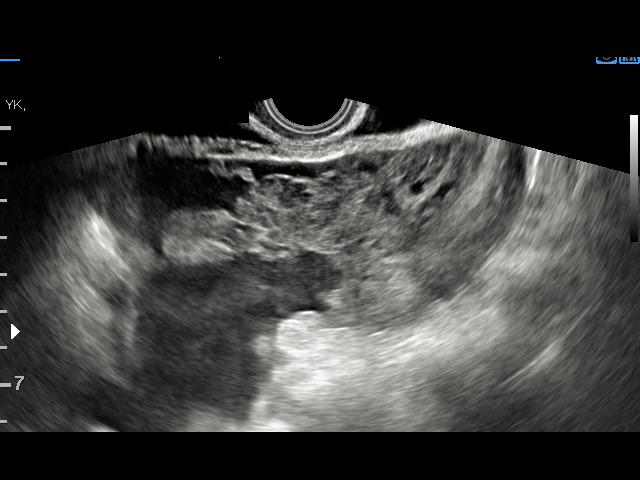
[im 111/111]
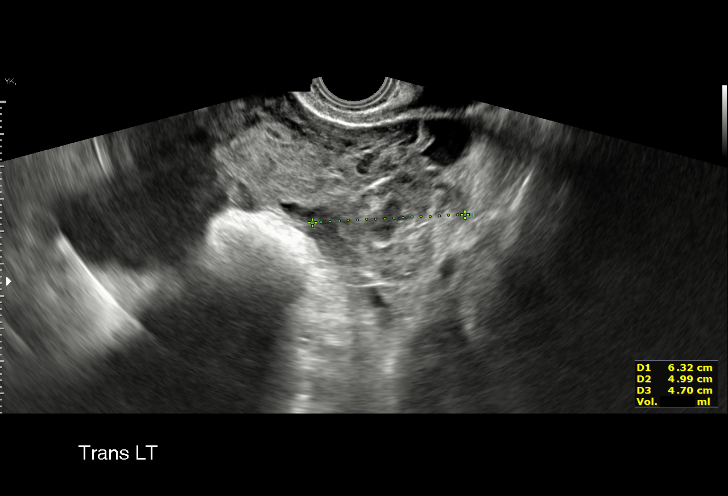

[14 of 28 positions shown; findings below may reference images not displayed]

FINDINGS: Intrauterine gestational sac: None

Maternal uterus/adnexae: Anteverted uterus is normal in size and
configuration, with no uterine fibroids or other myometrial
abnormalities. Bilayer endometrial thickness 15 mm. Heterogeneous
endometrium with small amount of ill-defined fluid in the left
endometrial cavity, with no discrete endometrial mass.

Right ovary measures 3.0 x 1.5 x 1.5 cm and is normal. No right
adnexal mass.

There is a 3.5 x 3.5 x 4.3 cm cyst left adnexal mass between the
uterus and left ovary, with an internal structure resembling a
gestational sac with thick vascularized wall and no yolk sac or
embryo demonstrated. The separate left ovary measures 3.9 x 2.2 x
1.8 cm. There is a large amount of ill-defined clot in the left
adnexa (measuring 6.3 x 5.0 x 4.7 cm in largest dimensions). There
is a large volume of hemoperitoneum throughout the pelvis extending
into the bilateral pericolic gutters and right subhepatic space.
IMPRESSION: 1. Ultrasound findings are compatible with ruptured left tubal
ectopic gestation with large amount of blood products in the left
adnexa measuring 6.3 x 5.0 x 4.7 cm in largest dimensions. Central
gestational sac-like structure within this left adnexal mass. No
yolk sac or embryo demonstrated within this left adnexal mass.
2. Large volume hemoperitoneum extending into the paracolic gutters
and right subhepatic space.
3. No intrauterine gestational sac. Heterogeneous thickened
endometrium compatible with blood products.

Critical Value/emergent results were called by telephone at the time
of interpretation on 04/09/2019 at [DATE] to provider HAFIJUL
MARIUS and OB/GYN DR. RTOYOTA, Who verbally acknowledged these
results.

## 2022-05-15 DIAGNOSIS — Z419 Encounter for procedure for purposes other than remedying health state, unspecified: Secondary | ICD-10-CM | POA: Diagnosis not present

## 2022-06-15 DIAGNOSIS — Z419 Encounter for procedure for purposes other than remedying health state, unspecified: Secondary | ICD-10-CM | POA: Diagnosis not present

## 2022-07-16 DIAGNOSIS — Z419 Encounter for procedure for purposes other than remedying health state, unspecified: Secondary | ICD-10-CM | POA: Diagnosis not present

## 2022-08-14 DIAGNOSIS — Z419 Encounter for procedure for purposes other than remedying health state, unspecified: Secondary | ICD-10-CM | POA: Diagnosis not present

## 2022-09-14 DIAGNOSIS — Z419 Encounter for procedure for purposes other than remedying health state, unspecified: Secondary | ICD-10-CM | POA: Diagnosis not present

## 2022-10-14 DIAGNOSIS — Z419 Encounter for procedure for purposes other than remedying health state, unspecified: Secondary | ICD-10-CM | POA: Diagnosis not present

## 2022-11-14 DIAGNOSIS — Z419 Encounter for procedure for purposes other than remedying health state, unspecified: Secondary | ICD-10-CM | POA: Diagnosis not present

## 2022-12-14 DIAGNOSIS — Z419 Encounter for procedure for purposes other than remedying health state, unspecified: Secondary | ICD-10-CM | POA: Diagnosis not present

## 2023-02-14 DIAGNOSIS — Z419 Encounter for procedure for purposes other than remedying health state, unspecified: Secondary | ICD-10-CM | POA: Diagnosis not present

## 2023-03-16 DIAGNOSIS — Z419 Encounter for procedure for purposes other than remedying health state, unspecified: Secondary | ICD-10-CM | POA: Diagnosis not present

## 2023-04-16 DIAGNOSIS — Z419 Encounter for procedure for purposes other than remedying health state, unspecified: Secondary | ICD-10-CM | POA: Diagnosis not present

## 2023-06-16 DIAGNOSIS — Z419 Encounter for procedure for purposes other than remedying health state, unspecified: Secondary | ICD-10-CM | POA: Diagnosis not present

## 2023-07-17 DIAGNOSIS — Z419 Encounter for procedure for purposes other than remedying health state, unspecified: Secondary | ICD-10-CM | POA: Diagnosis not present

## 2023-08-14 DIAGNOSIS — Z419 Encounter for procedure for purposes other than remedying health state, unspecified: Secondary | ICD-10-CM | POA: Diagnosis not present

## 2023-08-25 DIAGNOSIS — Z419 Encounter for procedure for purposes other than remedying health state, unspecified: Secondary | ICD-10-CM | POA: Diagnosis not present

## 2023-09-25 DIAGNOSIS — Z419 Encounter for procedure for purposes other than remedying health state, unspecified: Secondary | ICD-10-CM | POA: Diagnosis not present

## 2023-10-25 DIAGNOSIS — Z419 Encounter for procedure for purposes other than remedying health state, unspecified: Secondary | ICD-10-CM | POA: Diagnosis not present

## 2023-11-25 DIAGNOSIS — Z419 Encounter for procedure for purposes other than remedying health state, unspecified: Secondary | ICD-10-CM | POA: Diagnosis not present

## 2023-12-25 DIAGNOSIS — Z419 Encounter for procedure for purposes other than remedying health state, unspecified: Secondary | ICD-10-CM | POA: Diagnosis not present

## 2024-04-26 DIAGNOSIS — Z419 Encounter for procedure for purposes other than remedying health state, unspecified: Secondary | ICD-10-CM | POA: Diagnosis not present

## 2024-05-26 DIAGNOSIS — Z419 Encounter for procedure for purposes other than remedying health state, unspecified: Secondary | ICD-10-CM | POA: Diagnosis not present
